# Patient Record
Sex: Male | Born: 1940 | Race: White | Hispanic: No | Marital: Married | Smoking: Never smoker
Health system: Southern US, Community
[De-identification: ages and names within clinical notes are randomized; demographics above are authoritative.]

## PROBLEM LIST (undated history)

## (undated) DIAGNOSIS — I10 Essential (primary) hypertension: Secondary | ICD-10-CM

## (undated) DIAGNOSIS — IMO0002 Reserved for concepts with insufficient information to code with codable children: Secondary | ICD-10-CM

## (undated) DIAGNOSIS — I82409 Acute embolism and thrombosis of unspecified deep veins of unspecified lower extremity: Secondary | ICD-10-CM

## (undated) DIAGNOSIS — T8859XA Other complications of anesthesia, initial encounter: Secondary | ICD-10-CM

## (undated) DIAGNOSIS — R140 Abdominal distension (gaseous): Secondary | ICD-10-CM

## (undated) DIAGNOSIS — D869 Sarcoidosis, unspecified: Secondary | ICD-10-CM

## (undated) DIAGNOSIS — E785 Hyperlipidemia, unspecified: Secondary | ICD-10-CM

## (undated) DIAGNOSIS — M1990 Unspecified osteoarthritis, unspecified site: Secondary | ICD-10-CM

## (undated) DIAGNOSIS — T4145XA Adverse effect of unspecified anesthetic, initial encounter: Secondary | ICD-10-CM

## (undated) DIAGNOSIS — I2699 Other pulmonary embolism without acute cor pulmonale: Secondary | ICD-10-CM

## (undated) DIAGNOSIS — G459 Transient cerebral ischemic attack, unspecified: Secondary | ICD-10-CM

## (undated) DIAGNOSIS — G4730 Sleep apnea, unspecified: Secondary | ICD-10-CM

## (undated) DIAGNOSIS — M069 Rheumatoid arthritis, unspecified: Secondary | ICD-10-CM

## (undated) HISTORY — DX: Reserved for concepts with insufficient information to code with codable children: IMO0002

## (undated) HISTORY — PX: THORACOTOMY: SUR1349

## (undated) HISTORY — PX: OTHER SURGICAL HISTORY: SHX169

## (undated) HISTORY — DX: Unspecified osteoarthritis, unspecified site: M19.90

## (undated) HISTORY — DX: Sarcoidosis, unspecified: D86.9

## (undated) HISTORY — DX: Hyperlipidemia, unspecified: E78.5

## (undated) HISTORY — DX: Essential (primary) hypertension: I10

## (undated) HISTORY — PX: TONSILLECTOMY: SUR1361

## (undated) HISTORY — DX: Abdominal distension (gaseous): R14.0

## (undated) HISTORY — PX: ROTATOR CUFF REPAIR: SHX139

## (undated) HISTORY — PX: EXPLORATORY LAPAROTOMY: SUR591

---

## 2007-02-23 ENCOUNTER — Ambulatory Visit: Payer: Self-pay | Admitting: Gastroenterology

## 2007-03-09 ENCOUNTER — Ambulatory Visit: Payer: Self-pay | Admitting: Gastroenterology

## 2009-07-02 ENCOUNTER — Inpatient Hospital Stay (HOSPITAL_COMMUNITY): Admission: EM | Admit: 2009-07-02 | Discharge: 2009-07-31 | Payer: Self-pay | Admitting: Emergency Medicine

## 2009-07-03 ENCOUNTER — Ambulatory Visit: Payer: Self-pay | Admitting: Internal Medicine

## 2009-07-10 ENCOUNTER — Ambulatory Visit: Payer: Self-pay | Admitting: Physical Medicine & Rehabilitation

## 2009-08-17 ENCOUNTER — Ambulatory Visit (HOSPITAL_COMMUNITY): Admission: RE | Admit: 2009-08-17 | Discharge: 2009-08-17 | Payer: Self-pay | Admitting: General Surgery

## 2009-08-19 ENCOUNTER — Encounter (INDEPENDENT_AMBULATORY_CARE_PROVIDER_SITE_OTHER): Payer: Self-pay | Admitting: Endocrinology

## 2009-08-19 ENCOUNTER — Inpatient Hospital Stay (HOSPITAL_COMMUNITY): Admission: EM | Admit: 2009-08-19 | Discharge: 2009-08-25 | Payer: Self-pay | Admitting: Emergency Medicine

## 2009-08-19 ENCOUNTER — Ambulatory Visit: Payer: Self-pay | Admitting: Internal Medicine

## 2009-08-19 ENCOUNTER — Ambulatory Visit: Payer: Self-pay | Admitting: Vascular Surgery

## 2009-08-19 ENCOUNTER — Encounter: Payer: Self-pay | Admitting: Internal Medicine

## 2009-09-01 ENCOUNTER — Emergency Department (HOSPITAL_COMMUNITY): Admission: EM | Admit: 2009-09-01 | Discharge: 2009-09-01 | Payer: Self-pay | Admitting: Emergency Medicine

## 2009-09-08 ENCOUNTER — Encounter: Admission: RE | Admit: 2009-09-08 | Discharge: 2009-09-08 | Payer: Self-pay | Admitting: Internal Medicine

## 2009-09-16 DIAGNOSIS — E785 Hyperlipidemia, unspecified: Secondary | ICD-10-CM

## 2009-09-16 DIAGNOSIS — I80299 Phlebitis and thrombophlebitis of other deep vessels of unspecified lower extremity: Secondary | ICD-10-CM

## 2009-09-16 DIAGNOSIS — G4730 Sleep apnea, unspecified: Secondary | ICD-10-CM | POA: Insufficient documentation

## 2009-09-16 DIAGNOSIS — E119 Type 2 diabetes mellitus without complications: Secondary | ICD-10-CM

## 2009-09-16 DIAGNOSIS — K219 Gastro-esophageal reflux disease without esophagitis: Secondary | ICD-10-CM

## 2009-09-16 DIAGNOSIS — I4891 Unspecified atrial fibrillation: Secondary | ICD-10-CM | POA: Insufficient documentation

## 2009-09-16 DIAGNOSIS — M1990 Unspecified osteoarthritis, unspecified site: Secondary | ICD-10-CM | POA: Insufficient documentation

## 2009-09-16 DIAGNOSIS — K269 Duodenal ulcer, unspecified as acute or chronic, without hemorrhage or perforation: Secondary | ICD-10-CM | POA: Insufficient documentation

## 2009-09-16 DIAGNOSIS — E669 Obesity, unspecified: Secondary | ICD-10-CM

## 2009-09-16 DIAGNOSIS — I10 Essential (primary) hypertension: Secondary | ICD-10-CM | POA: Insufficient documentation

## 2009-09-17 ENCOUNTER — Ambulatory Visit: Payer: Self-pay | Admitting: Pulmonary Disease

## 2009-09-17 DIAGNOSIS — Z9981 Dependence on supplemental oxygen: Secondary | ICD-10-CM

## 2009-09-17 DIAGNOSIS — I2699 Other pulmonary embolism without acute cor pulmonale: Secondary | ICD-10-CM | POA: Insufficient documentation

## 2009-09-19 DIAGNOSIS — D869 Sarcoidosis, unspecified: Secondary | ICD-10-CM | POA: Insufficient documentation

## 2009-10-05 ENCOUNTER — Telehealth (INDEPENDENT_AMBULATORY_CARE_PROVIDER_SITE_OTHER): Payer: Self-pay | Admitting: *Deleted

## 2009-10-12 ENCOUNTER — Telehealth: Payer: Self-pay | Admitting: Pulmonary Disease

## 2009-12-16 ENCOUNTER — Ambulatory Visit (HOSPITAL_BASED_OUTPATIENT_CLINIC_OR_DEPARTMENT_OTHER): Admission: RE | Admit: 2009-12-16 | Discharge: 2009-12-16 | Payer: Self-pay | Admitting: Pulmonary Disease

## 2009-12-16 ENCOUNTER — Encounter: Payer: Self-pay | Admitting: Pulmonary Disease

## 2009-12-25 ENCOUNTER — Ambulatory Visit: Payer: Self-pay | Admitting: Pulmonary Disease

## 2010-01-12 ENCOUNTER — Telehealth (INDEPENDENT_AMBULATORY_CARE_PROVIDER_SITE_OTHER): Payer: Self-pay | Admitting: *Deleted

## 2010-01-14 ENCOUNTER — Encounter: Payer: Self-pay | Admitting: Pulmonary Disease

## 2010-02-12 ENCOUNTER — Ambulatory Visit: Payer: Self-pay | Admitting: Pulmonary Disease

## 2010-06-29 NOTE — Assessment & Plan Note (Signed)
Summary: rov/apc   Visit Type:  Follow-up Primary Provider/Referring Provider:  Darral Dash  CC:  Pt here for follow up. Pt c/o non-productive cough when laying down at night. Using CPAP everynight.  History of Present Illness: 69/M with DM-2, Htn & sarcoidosis was hospitalised 07/02/09- 07/30/09 following DU perforation requiring emergent surgery & sepsis. He required mechanical ventilation, & was felt to have obesity hypoventilation & had good results with empiric CPAP therapy. He was re-admitted 3/22- 3/29 for extensive bilateral DVT / PE with RV dilatation on echo. He was then dc'd on coumadin & o2 since, last INR was 3.1 on 08/28/09. CPAP is set at 8 cm, he still has abdominal drain & is not very mobile. O2 satn today is 93% on RA & 99 % on 2 L [place].  February 12, 2010 4:06 PM  sleeping well with CPAP PSG showed mild obstructive sleep apnea with predominant hypopneas AHI 11/ & lowest desatn of 81%. Severe PLms @ 113/h. AutoCPAP download showed avg pr of 13 cm, good control of events, good compliance. C/o occasional cough at bedtime - denies post nasal drip, heartburn or wheezing  Current Medications (verified): 1)  Tetracycline Hcl 250 Mg Caps (Tetracycline Hcl) .... Take 1 Tablet By Mouth Once A Day 2)  Apidra Solostar 100 Unit/ml Soln (Insulin Glulisine) .... Sliding Scale 3)  Lantus 100 Unit/ml Soln (Insulin Glargine) .Marland Kitchen.. 16 Every Morning and 22 Units Every Night Subcutaneously 4)  Zocor 40 Mg Tabs (Simvastatin) .Marland Kitchen.. 1 By Mouth At Bedtime 5)  Prilosec 20 Mg Cpdr (Omeprazole) .... Take 1 Tablet By Mouth Once A Day 6)  Furosemide 40 Mg Tabs (Furosemide) .... Take 1 Tablet By Mouth Two Times A Day 7)  Coumadin 3 Mg Tabs (Warfarin Sodium) .... Take 1 Tablet By Mouth Once A Day 8)  Zaroxolyn 2.5 Mg Tabs (Metolazone) .Marland Kitchen.. 1 By Mouth Daily 9)  Synthroid 25 Mcg Tabs (Levothyroxine Sodium) .... Take 1 Tablet By Mouth Once A Day 10)  Vitamin D (Ergocalciferol) 50000 Unit Caps (Ergocalciferol) ....  Take 1 Tablet By Mouth Once A Week 11)  Diltiazem Hcl 120 Mg Tabs (Diltiazem Hcl) .... Take 1 Tablet By Mouth Once A Day 12)  Methotrexate 2.5 Mg Tabs (Methotrexate Sodium) .... Take 6 Tablets By Mouth Once A Week 13)  Glipizide 10 Mg Tabs (Glipizide) .... Take 1 Tablet By Mouth Two Times A Day 14)  Potassium Chloride Crys Cr 20 Meq Cr-Tabs (Potassium Chloride Crys Cr) .... Take 3 Tablet By Mouth Two Times A Day 15)  Multivitamins   Tabs (Multiple Vitamin) .... Take 1 Tablet By Mouth Once A Day 16)  Folic Acid 400 Mcg Tabs (Folic Acid) .... Take 2 1/2 Tablet By Mouth Once A Day  Allergies (verified): No Known Drug Allergies  Past History:  Past Medical History: Last updated: 09/17/2009 Current Problems:  OSTEOARTHRITIS (ICD-715.90) HYPERTENSION (ICD-401.9) SLEEP APNEA (ICD-780.57) ATRIAL FIBRILLATION (ICD-427.31) DUODENAL ULCER (ICD-532.90) OBESITY (ICD-278.00) HYPERLIPIDEMIA (ICD-272.4) G E R D (ICD-530.81) DIABETES, TYPE 2 (ICD-250.00) Hx of DEEP VEIN THROMBOSIS/PHLEBITIS (ICD-451.19) Sarcoidosis - thoracotomy in 1973  Social History: Last updated: 09/17/2009 Married 4 children Patient never smoked.   Review of Systems       The patient complains of prolonged cough.  The patient denies anorexia, fever, weight loss, weight gain, vision loss, decreased hearing, hoarseness, chest pain, syncope, dyspnea on exertion, peripheral edema, headaches, hemoptysis, abdominal pain, melena, hematochezia, severe indigestion/heartburn, hematuria, muscle weakness, suspicious skin lesions, difficulty walking, depression, unusual weight change, abnormal bleeding, enlarged  lymph nodes, and angioedema.    Vital Signs:  Patient profile:   70 year old male Height:      73 inches Weight:      255.50 pounds O2 Sat:      96 % on Room air Temp:     98.4 degrees F oral Pulse rate:   90 / minute BP sitting:   100 / 60  (left arm) Cuff size:   large  Vitals Entered By: Zackery Barefoot CMA  (February 12, 2010 3:45 PM)  O2 Flow:  Room air CC: Pt here for follow up. Pt c/o non-productive cough when laying down at night. Using CPAP everynight Comments Medications reviewed with patient Verified contact number and pharmacy with patient Zackery Barefoot [facility]  February 12, 2010 3:46 PM    Physical Exam  Additional Exam:  wt 255 February 12, 2010  Gen. Pleasant, well-nourished, in no distress, normal affect, ambulates with cane ENT - no lesions, no post nasal drip, class 2 airway Neck: No JVD, no thyromegaly, no carotid bruits Lungs: no use of accessory muscles, no dullness to percussion, clear without rales or rhonchi  Cardiovascular: Rhythm regular, heart sounds  normal, no murmurs or gallops, no peripheral edema Musculoskeletal: No deformities, no cyanosis or clubbing      Impression & Recommendations:  Problem # 1:  SLEEP APNEA (ICD-780.57) Change to fixed CPAP 13 cm, get download in 1 month Compliance encouraged, wt loss emphasized, asked to avoid meds with sedative side effects, cautioned against driving when sleepy.  Orders: DME Referral (DME) Est. Patient Level IV (40981)  Problem # 2:  SARCOIDOSIS (ICD-135) ? cause of cough Some bibasal scarring on abd CT  - FU CXR today  Medications Added to Medication List This Visit: 1)  Tetracycline Hcl 250 Mg Caps (Tetracycline hcl) .... Take 1 tablet by mouth once a day 2)  Apidra Solostar 100 Unit/ml Soln (Insulin glulisine) .... Sliding scale 3)  Lantus 100 Unit/ml Soln (Insulin glargine) .Marland Kitchen.. 16 every morning and 22 units every night subcutaneously 4)  Furosemide 40 Mg Tabs (Furosemide) .... Take 1 tablet by mouth two times a day 5)  Coumadin 3 Mg Tabs (Warfarin sodium) .... Take 1 tablet by mouth once a day 6)  Synthroid 25 Mcg Tabs (Levothyroxine sodium) .... Take 1 tablet by mouth once a day 7)  Vitamin D (ergocalciferol) 50000 Unit Caps (Ergocalciferol) .... Take 1 tablet by mouth once a week 8)  Diltiazem Hcl  120 Mg Tabs (Diltiazem hcl) .... Take 1 tablet by mouth once a day 9)  Methotrexate 2.5 Mg Tabs (Methotrexate sodium) .... Take 6 tablets by mouth once a week 10)  Glipizide 10 Mg Tabs (Glipizide) .... Take 1 tablet by mouth two times a day 11)  Potassium Chloride Crys Cr 20 Meq Cr-tabs (Potassium chloride crys cr) .... Take 3 tablet by mouth two times a day 12)  Multivitamins Tabs (Multiple vitamin) .... Take 1 tablet by mouth once a day 13)  Folic Acid 400 Mcg Tabs (Folic acid) .... Take 2 1/2 tablet by mouth once a day  Other Orders: T-2 View CXR ([id number])  Patient Instructions: 1)  Copy sent to:dr tisovec 2)  Please schedule a follow-up appointment in 6 months. 3)  I will change to CPAP of 13 cm & get a download in 1 month

## 2010-06-29 NOTE — Progress Notes (Signed)
Summary: results  Phone Note Call from Patient Call back at [phone]   Caller: Patient Call For: alva Summary of Call: calling for results of sleep study Initial call taken by: Rickard Patience,  January 12, 2010 1:49 PM  Follow-up for Phone Call        pt calling for sleep study results done at [facility] on 12-16-2009.  Please advise.  Thanks.  Aundra Millet Reynolds LPN  January 12, 2010 2:04 PM   Mild obstructive sleep apnea  - continue on CPAP for now, discuss on FU visit, obtain CPAP download Follow-up by: Comer Locket. Vassie Loll MD,  January 12, 2010 2:37 PM  Additional Follow-up for Phone Call Additional follow up Details #1::        Pt aware of results and is making follow up visit with RA for mid September.Reynaldo Minium CMA  January 12, 2010 2:47 PM      Appended Document: results download 7/30 -8/18 >> good avg use, no residual events, avg pr 13 cm

## 2010-06-29 NOTE — Progress Notes (Signed)
Summary: cpap order  Phone Note Call from Patient Call back at Home Phone [phone]   Caller: Spouse//Fred Alvarez Call For: Fred Alvarez Summary of Call: Wants order for cpap machine. Initial call taken by: Darletta Moll,  Oct 05, 2009 10:22 AM  Follow-up for Phone Call        Pt has been discharged home and needs an order for a cpap machine to have at home sent to [facility]. Please advise if ok to send order.Carron Curie CMA  Oct 05, 2009 10:52 AM   done Follow-up by: Comer Locket. Vassie Loll MD,  Oct 05, 2009 1:14 PM  Additional Follow-up for Phone Call Additional follow up Details #1::        Spoke with pt and advised that order was sent for his cpap machine. Additional Follow-up by: Vernie Murders,  Oct 05, 2009 1:36 PM

## 2010-06-29 NOTE — Assessment & Plan Note (Signed)
Summary: 4 week post hosp per Alva/apc   Visit Type:  Hospital Follow-up Primary Provider/Referring Provider:  Tisovek  CC:  Pt is here for a post hosp f/u appt.  Pt states breathing is "good."  Pt denied a cough.  Pt  states he is wearing his cpap machine almost every night.  Pt denied any pressure with cpap machine.  Marland Kitchen  History of Present Illness: 69/M with DM-2, Htn & sarcoidosis was hospitalised 07/02/09- 07/30/09 following DU perforation requiring emergent surgery & sepsis. He required mechanical ventilation, & was felt to have obesity hypoventilation & had good results with empiric CPAP therapy. He was re-admitted 3/22- 3/29 for extensive bilateral DVT / PE with RV dilatation on echo. He was then dc'd on coumadin & o2 since, last INR was 3.1 on 08/28/09. CPAP is set at 8 cm, he still has abdominal drain & is not very mobile. O2 satn today is 93% on RA & 99 % on 2 L [place].  Preventive Screening-Counseling & Management  Alcohol-Tobacco     Smoking Status: never  Medications Prior to Update: 1)  Lantus 100 Unit/ml Soln (Insulin Glargine) .Marland Kitchen.. 12u Subcutaneously Two Times A Day 2)  Zocor 40 Mg Tabs (Simvastatin) .Marland Kitchen.. 1 By Mouth At Bedtime 3)  Protonix 40 Mg Tbec (Pantoprazole Sodium) .Marland Kitchen.. 1 By Mouth Daily 4)  Furosemide 40 Mg Tabs (Furosemide) .Marland Kitchen.. 1 By Mouth Three Times A Day 5)  Coumadin 5 Mg Tabs (Warfarin Sodium) .... As Directed 6)  Santyl 250 Unit/gm Oint (Collagenase) .... Apply To Wound Once Daily 7)  Zaroxolyn 2.5 Mg Tabs (Metolazone) .Marland Kitchen.. 1 By Mouth Daily 8)  Lotrimin Ultra 1 % Crea (Butenafine Hcl) .... Apply To Groin Rash Two Times A Day 9)  Ensure Pudding  Pudg (Nutritional Supplements) .Marland Kitchen.. 1 Three Times A Day 10)  Tylenol 325 Mg Tabs (Acetaminophen) .Marland Kitchen.. 1 To 2 By Mouth Every 6 Hours As Needed 11)  Ambien 5 Mg Tabs (Zolpidem Tartrate) .Marland Kitchen.. 1 By Mouth At Bedtime As Needed 12)  Gentle Laxative 5 Mg Tbec (Bisacodyl) .... As Needed  Current Medications (verified): 1)  Lantus 100  Unit/ml Soln (Insulin Glargine) .Marland Kitchen.. 12u Subcutaneously Two Times A Day 2)  Zocor 40 Mg Tabs (Simvastatin) .Marland Kitchen.. 1 By Mouth At Bedtime 3)  Prilosec 20 Mg Cpdr (Omeprazole) .... Take 1 Tablet By Mouth Once A Day 4)  Furosemide 40 Mg Tabs (Furosemide) .Marland Kitchen.. 1 By Mouth Three Times A Day 5)  Coumadin 5 Mg Tabs (Warfarin Sodium) .... As Directed 6)  Zaroxolyn 2.5 Mg Tabs (Metolazone) .Marland Kitchen.. 1 By Mouth Daily 7)  Tylenol 325 Mg Tabs (Acetaminophen) .Marland Kitchen.. 1 To 2 By Mouth Every 6 Hours As Needed 8)  Ambien 5 Mg Tabs (Zolpidem Tartrate) .Marland Kitchen.. 1 By Mouth At Bedtime As Needed 9)  Humalog 100 Unit/ml Soln (Insulin Lispro (Human)) .... Use As Directed  Allergies (verified): No Known Drug Allergies  Past History:  Past Medical History: Current Problems:  OSTEOARTHRITIS (ICD-715.90) HYPERTENSION (ICD-401.9) SLEEP APNEA (ICD-780.57) ATRIAL FIBRILLATION (ICD-427.31) DUODENAL ULCER (ICD-532.90) OBESITY (ICD-278.00) HYPERLIPIDEMIA (ICD-272.4) G E R D (ICD-530.81) DIABETES, TYPE 2 (ICD-250.00) Hx of DEEP VEIN THROMBOSIS/PHLEBITIS (ICD-451.19) Sarcoidosis - thoracotomy in 1973  Past Surgical History: Thoracotomy Right Rotator Cuff Repair  Family History: Family History Pulmonary Embolism/Clot---father Family History C V A / Stroke ---mother Family History Hypertension---3 brothers  Social History: Married 4 children Patient never smoked.  Smoking Status:  never  Review of Systems       The patient complains of  shortness of breath with activity and weight change.  The patient denies shortness of breath at rest, productive cough, non-productive cough, coughing up blood, chest pain, irregular heartbeats, acid heartburn, indigestion, loss of appetite, abdominal pain, difficulty swallowing, sore throat, tooth/dental problems, headaches, nasal congestion/difficulty breathing through nose, sneezing, itching, ear ache, anxiety, depression, hand/feet swelling, joint stiffness or pain, rash, change in color  of mucus, and fever.    Vital Signs:  Patient profile:   70 year old male Height:      73 inches BMI:     39.20 O2 Sat:      99 % on 2 LPM  Temp:     97.3 degrees F oral Pulse rate:   104 / minute BP sitting:   100 / 78  (right arm) Cuff size:   large  Vitals Entered By: Arman Filter LPN (September 17, 2009 1:39 PM)  O2 Flow:  2 LPM  CC: Pt is here for a post hosp f/u appt.  Pt states breathing is "good."  Pt denied a cough.  Pt  states he is wearing his cpap machine almost every night.  Pt denied any pressure with cpap machine.   Comments Unable to weigh pt.  Pt is wheelchair bound. Medications reviewed with patient Arman Filter LPN  September 17, 2009 1:39 PM   checked pt's o2 sats on RA.  o2 sats were 93% on RA, therefore per RA- ok for pt to be off oxygen at rest but still needs to wear at bedtime and with exertion. pt informed of this.   Aundra Millet Reynolds LPN  September 17, 2009 2:50 PM    Physical Exam  Additional Exam:  Gen. 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 Hopefully his mobility will increase His updated medication list for this problem includes:    Coumadin 5 Mg Tabs (Warfarin sodium) .Marland Kitchen... As directed  Orders: Est. Patient Level IV (46962)  Problem # 2:  SLEEP APNEA (ICD-780.57)  He ahs lost  considerable weight sinc ehis hospital admission Will schedule sleep study once more mobile & over acute illness Meanwhile ct empiric CPAP 8 cm , if worse excessive daytime somnolence , can change to auto 5-15  Seems to be tolerating well.  Orders: Est. 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  Orders: Est. Patient Level IV (13244)  Medications Added to Medication List This Visit: 1)  Prilosec 20 Mg Cpdr (Omeprazole) .... Take 1 tablet by mouth once a day 2)  Humalog 100 Unit/ml Soln (Insulin lispro (human)) .... Use as directed  Patient Instructions: 1)  Copy sent to: Dr Evlyn Kanner 2)  Please schedule a follow-up appointment in 1 month. 3)  You should be placed on autoCPAP 5-15 4)  Check oxygen at rest on RA  Immunization History:  Influenza Immunization History:    Influenza:  historical (02/27/2009)  Pneumovax Immunization History:    Pneumovax:  historical (02/27/2009)  Appended Document: 4 week post hosp per Alva/apc Split Night use 02 as needed scheduled for 12/16/09. Order faxed.

## 2010-06-29 NOTE — Progress Notes (Signed)
Summary: ?appt  Phone Note Call from Patient Call back at Home Phone [phone]   Caller: Pasty Spillers Call For: alva Reason for Call: Talk to Doctor Summary of Call: Per Shanda Bumps, pt hasn't had sleep study yet per wife.  Just out of hosp & rehab, not mobile enough to go for sleep study.  Please advise if pt needs to keep appt on 10/16/2009 w/Alva? Initial call taken by: Eugene Gavia,  Oct 12, 2009 9:32 AM  Follow-up for Phone Call        ok to defer until after sleep study  Follow-up by: Comer Locket. Vassie Loll MD,  Oct 12, 2009 12:43 PM  Additional Follow-up for Phone Call Additional follow up Details #1::        Pt informed of Dr Reginia Naas recommendations. Pt will reschedule when he feels he is able. Abigail Miyamoto RN  Oct 12, 2009 1:18 PM

## 2010-08-19 LAB — COMPREHENSIVE METABOLIC PANEL
ALT: 22 U/L (ref 0–53)
ALT: 38 U/L (ref 0–53)
ALT: 50 U/L (ref 0–53)
ALT: 51 U/L (ref 0–53)
ALT: 62 U/L — ABNORMAL HIGH (ref 0–53)
AST: 24 U/L (ref 0–37)
AST: 27 U/L (ref 0–37)
AST: 34 U/L (ref 0–37)
AST: 39 U/L — ABNORMAL HIGH (ref 0–37)
AST: 48 U/L — ABNORMAL HIGH (ref 0–37)
AST: 54 U/L — ABNORMAL HIGH (ref 0–37)
AST: 55 U/L — ABNORMAL HIGH (ref 0–37)
AST: 59 U/L — ABNORMAL HIGH (ref 0–37)
Albumin: 1.5 g/dL — ABNORMAL LOW (ref 3.5–5.2)
Albumin: 1.7 g/dL — ABNORMAL LOW (ref 3.5–5.2)
Albumin: 2 g/dL — ABNORMAL LOW (ref 3.5–5.2)
Albumin: 2 g/dL — ABNORMAL LOW (ref 3.5–5.2)
Albumin: 2.1 g/dL — ABNORMAL LOW (ref 3.5–5.2)
Alkaline Phosphatase: 145 U/L — ABNORMAL HIGH (ref 39–117)
Alkaline Phosphatase: 154 U/L — ABNORMAL HIGH (ref 39–117)
Alkaline Phosphatase: 181 U/L — ABNORMAL HIGH (ref 39–117)
BUN: 21 mg/dL (ref 6–23)
BUN: 35 mg/dL — ABNORMAL HIGH (ref 6–23)
BUN: 46 mg/dL — ABNORMAL HIGH (ref 6–23)
CO2: 34 mEq/L — ABNORMAL HIGH (ref 19–32)
CO2: 35 mEq/L — ABNORMAL HIGH (ref 19–32)
CO2: 35 mEq/L — ABNORMAL HIGH (ref 19–32)
CO2: 38 mEq/L — ABNORMAL HIGH (ref 19–32)
CO2: 40 mEq/L — ABNORMAL HIGH (ref 19–32)
Calcium: 8.2 mg/dL — ABNORMAL LOW (ref 8.4–10.5)
Calcium: 8.4 mg/dL (ref 8.4–10.5)
Calcium: 8.6 mg/dL (ref 8.4–10.5)
Chloride: 102 mEq/L (ref 96–112)
Chloride: 102 mEq/L (ref 96–112)
Chloride: 102 mEq/L (ref 96–112)
Chloride: 104 mEq/L (ref 96–112)
Chloride: 106 mEq/L (ref 96–112)
Chloride: 91 mEq/L — ABNORMAL LOW (ref 96–112)
Creatinine, Ser: 1.12 mg/dL (ref 0.4–1.5)
Creatinine, Ser: 1.18 mg/dL (ref 0.4–1.5)
Creatinine, Ser: 1.28 mg/dL (ref 0.4–1.5)
Creatinine, Ser: 1.33 mg/dL (ref 0.4–1.5)
Creatinine, Ser: 1.36 mg/dL (ref 0.4–1.5)
Creatinine, Ser: 1.5 mg/dL (ref 0.4–1.5)
GFR calc Af Amer: >60 mL/min (ref >60)
GFR calc Af Amer: >60 mL/min (ref >60)
GFR calc Af Amer: >60 mL/min (ref >60)
GFR calc Af Amer: >60 mL/min (ref >60)
GFR calc Af Amer: >60 mL/min (ref >60)
GFR calc Af Amer: >60 mL/min (ref >60)
GFR calc Af Amer: >60 mL/min (ref >60)
GFR calc non Af Amer: 52 mL/min — ABNORMAL LOW (ref >60)
GFR calc non Af Amer: >60 mL/min (ref >60)
GFR calc non Af Amer: >60 mL/min (ref >60)
GFR calc non Af Amer: >60 mL/min (ref >60)
Glucose, Bld: 127 mg/dL — ABNORMAL HIGH (ref 70–99)
Glucose, Bld: 156 mg/dL — ABNORMAL HIGH (ref 70–99)
Potassium: 3.7 mEq/L (ref 3.5–5.1)
Potassium: 4 mEq/L (ref 3.5–5.1)
Potassium: 4.2 mEq/L (ref 3.5–5.1)
Sodium: 134 mEq/L — ABNORMAL LOW (ref 135–145)
Sodium: 137 mEq/L (ref 135–145)
Sodium: 137 mEq/L (ref 135–145)
Sodium: 141 mEq/L (ref 135–145)
Sodium: 145 mEq/L (ref 135–145)
Sodium: 148 mEq/L — ABNORMAL HIGH (ref 135–145)
Total Bilirubin: 0.8 mg/dL (ref 0.3–1.2)
Total Bilirubin: 1.5 mg/dL — ABNORMAL HIGH (ref 0.3–1.2)
Total Bilirubin: 1.8 mg/dL — ABNORMAL HIGH (ref 0.3–1.2)
Total Bilirubin: 1.8 mg/dL — ABNORMAL HIGH (ref 0.3–1.2)
Total Protein: 5.2 g/dL — ABNORMAL LOW (ref 6.0–8.3)
Total Protein: 5.2 g/dL — ABNORMAL LOW (ref 6.0–8.3)
Total Protein: 5.6 g/dL — ABNORMAL LOW (ref 6.0–8.3)
Total Protein: 5.8 g/dL — ABNORMAL LOW (ref 6.0–8.3)
Total Protein: 6 g/dL (ref 6.0–8.3)

## 2010-08-19 LAB — CBC
HCT: 28.6 % — ABNORMAL LOW (ref 39.0–52.0)
HCT: 29.7 % — ABNORMAL LOW (ref 39.0–52.0)
HCT: 30.6 % — ABNORMAL LOW (ref 39.0–52.0)
HCT: 30.7 % — ABNORMAL LOW (ref 39.0–52.0)
HCT: 30.9 % — ABNORMAL LOW (ref 39.0–52.0)
HCT: 31.3 % — ABNORMAL LOW (ref 39.0–52.0)
HCT: 31.7 % — ABNORMAL LOW (ref 39.0–52.0)
HCT: 31.8 % — ABNORMAL LOW (ref 39.0–52.0)
HCT: 33.5 % — ABNORMAL LOW (ref 39.0–52.0)
HCT: 36.4 % — ABNORMAL LOW (ref 39.0–52.0)
HCT: 37.1 % — ABNORMAL LOW (ref 39.0–52.0)
HCT: 37.2 % — ABNORMAL LOW (ref 39.0–52.0)
Hemoglobin: 10.2 g/dL — ABNORMAL LOW (ref 13.0–17.0)
Hemoglobin: 10.4 g/dL — ABNORMAL LOW (ref 13.0–17.0)
Hemoglobin: 10.4 g/dL — ABNORMAL LOW (ref 13.0–17.0)
Hemoglobin: 10.5 g/dL — ABNORMAL LOW (ref 13.0–17.0)
Hemoglobin: 11.3 g/dL — ABNORMAL LOW (ref 13.0–17.0)
Hemoglobin: 12 g/dL — ABNORMAL LOW (ref 13.0–17.0)
Hemoglobin: 12.2 g/dL — ABNORMAL LOW (ref 13.0–17.0)
Hemoglobin: 12.3 g/dL — ABNORMAL LOW (ref 13.0–17.0)
Hemoglobin: 9.4 g/dL — ABNORMAL LOW (ref 13.0–17.0)
Hemoglobin: 9.6 g/dL — ABNORMAL LOW (ref 13.0–17.0)
MCHC: 32.7 g/dL (ref 30.0–36.0)
MCHC: 32.8 g/dL (ref 30.0–36.0)
MCHC: 33 g/dL (ref 30.0–36.0)
MCHC: 33.2 g/dL (ref 30.0–36.0)
MCHC: 33.4 g/dL (ref 30.0–36.0)
MCHC: 33.7 g/dL (ref 30.0–36.0)
MCHC: 33.7 g/dL (ref 30.0–36.0)
MCHC: 33.7 g/dL (ref 30.0–36.0)
MCHC: 33.7 g/dL (ref 30.0–36.0)
MCHC: 33.8 g/dL (ref 30.0–36.0)
MCHC: 34 g/dL (ref 30.0–36.0)
MCHC: 34 g/dL (ref 30.0–36.0)
MCV: 88.9 fL (ref 78.0–100.0)
MCV: 90.2 fL (ref 78.0–100.0)
MCV: 90.2 fL (ref 78.0–100.0)
MCV: 90.3 fL (ref 78.0–100.0)
MCV: 90.3 fL (ref 78.0–100.0)
MCV: 90.4 fL (ref 78.0–100.0)
MCV: 90.7 fL (ref 78.0–100.0)
MCV: 91 fL (ref 78.0–100.0)
MCV: 91.1 fL (ref 78.0–100.0)
MCV: 91.2 fL (ref 78.0–100.0)
MCV: 91.3 fL (ref 78.0–100.0)
MCV: 91.4 fL (ref 78.0–100.0)
MCV: 91.4 fL (ref 78.0–100.0)
MCV: 91.9 fL (ref 78.0–100.0)
MCV: 92.2 fL (ref 78.0–100.0)
MCV: 92.3 fL (ref 78.0–100.0)
Platelets: 176 10*3/uL (ref 150–400)
Platelets: 193 10*3/uL (ref 150–400)
Platelets: 196 10*3/uL (ref 150–400)
Platelets: 242 10*3/uL (ref 150–400)
Platelets: 252 10*3/uL (ref 150–400)
Platelets: 260 10*3/uL (ref 150–400)
Platelets: 269 10*3/uL (ref 150–400)
Platelets: 272 10*3/uL (ref 150–400)
Platelets: 313 10*3/uL (ref 150–400)
Platelets: 334 10*3/uL (ref 150–400)
Platelets: 336 10*3/uL (ref 150–400)
Platelets: 343 10*3/uL (ref 150–400)
RBC: 3.1 MIL/uL — ABNORMAL LOW (ref 4.22–5.81)
RBC: 3.14 MIL/uL — ABNORMAL LOW (ref 4.22–5.81)
RBC: 3.31 MIL/uL — ABNORMAL LOW (ref 4.22–5.81)
RBC: 3.32 MIL/uL — ABNORMAL LOW (ref 4.22–5.81)
RBC: 3.35 MIL/uL — ABNORMAL LOW (ref 4.22–5.81)
RBC: 3.47 MIL/uL — ABNORMAL LOW (ref 4.22–5.81)
RBC: 3.47 MIL/uL — ABNORMAL LOW (ref 4.22–5.81)
RBC: 3.58 MIL/uL — ABNORMAL LOW (ref 4.22–5.81)
RBC: 3.68 MIL/uL — ABNORMAL LOW (ref 4.22–5.81)
RBC: 3.83 MIL/uL — ABNORMAL LOW (ref 4.22–5.81)
RBC: 3.97 MIL/uL — ABNORMAL LOW (ref 4.22–5.81)
RBC: 4.03 MIL/uL — ABNORMAL LOW (ref 4.22–5.81)
RBC: 4.09 MIL/uL — ABNORMAL LOW (ref 4.22–5.81)
RDW: 15.6 % — ABNORMAL HIGH (ref 11.5–15.5)
RDW: 15.7 % — ABNORMAL HIGH (ref 11.5–15.5)
RDW: 15.8 % — ABNORMAL HIGH (ref 11.5–15.5)
RDW: 15.8 % — ABNORMAL HIGH (ref 11.5–15.5)
RDW: 15.9 % — ABNORMAL HIGH (ref 11.5–15.5)
RDW: 15.9 % — ABNORMAL HIGH (ref 11.5–15.5)
RDW: 16 % — ABNORMAL HIGH (ref 11.5–15.5)
RDW: 16.1 % — ABNORMAL HIGH (ref 11.5–15.5)
RDW: 16.1 % — ABNORMAL HIGH (ref 11.5–15.5)
RDW: 16.1 % — ABNORMAL HIGH (ref 11.5–15.5)
RDW: 16.1 % — ABNORMAL HIGH (ref 11.5–15.5)
RDW: 16.3 % — ABNORMAL HIGH (ref 11.5–15.5)
WBC: 10.5 10*3/uL (ref 4.0–10.5)
WBC: 11.4 10*3/uL — ABNORMAL HIGH (ref 4.0–10.5)
WBC: 13.4 10*3/uL — ABNORMAL HIGH (ref 4.0–10.5)
WBC: 13.4 10*3/uL — ABNORMAL HIGH (ref 4.0–10.5)
WBC: 5.4 10*3/uL (ref 4.0–10.5)
WBC: 5.6 10*3/uL (ref 4.0–10.5)
WBC: 6.2 10*3/uL (ref 4.0–10.5)
WBC: 6.3 10*3/uL (ref 4.0–10.5)
WBC: 7.8 10*3/uL (ref 4.0–10.5)
WBC: 7.8 10*3/uL (ref 4.0–10.5)
WBC: 9.6 10*3/uL (ref 4.0–10.5)

## 2010-08-19 LAB — URINALYSIS, ROUTINE W REFLEX MICROSCOPIC
Glucose, UA: NEGATIVE mg/dL
Ketones, ur: NEGATIVE mg/dL
Nitrite: NEGATIVE
Nitrite: POSITIVE — AB
Protein, ur: NEGATIVE mg/dL
Specific Gravity, Urine: 1.028 (ref 1.005–1.030)
Urobilinogen, UA: 0.2 mg/dL (ref 0.0–1.0)
Urobilinogen, UA: 1 mg/dL (ref 0.0–1.0)
pH: 5 (ref 5.0–8.0)
pH: 6 (ref 5.0–8.0)

## 2010-08-19 LAB — GLUCOSE, CAPILLARY
Glucose-Capillary: 108 mg/dL — ABNORMAL HIGH (ref 70–99)
Glucose-Capillary: 114 mg/dL — ABNORMAL HIGH (ref 70–99)
Glucose-Capillary: 116 mg/dL — ABNORMAL HIGH (ref 70–99)
Glucose-Capillary: 116 mg/dL — ABNORMAL HIGH (ref 70–99)
Glucose-Capillary: 118 mg/dL — ABNORMAL HIGH (ref 70–99)
Glucose-Capillary: 120 mg/dL — ABNORMAL HIGH (ref 70–99)
Glucose-Capillary: 122 mg/dL — ABNORMAL HIGH (ref 70–99)
Glucose-Capillary: 124 mg/dL — ABNORMAL HIGH (ref 70–99)
Glucose-Capillary: 127 mg/dL — ABNORMAL HIGH (ref 70–99)
Glucose-Capillary: 127 mg/dL — ABNORMAL HIGH (ref 70–99)
Glucose-Capillary: 127 mg/dL — ABNORMAL HIGH (ref 70–99)
Glucose-Capillary: 127 mg/dL — ABNORMAL HIGH (ref 70–99)
Glucose-Capillary: 128 mg/dL — ABNORMAL HIGH (ref 70–99)
Glucose-Capillary: 128 mg/dL — ABNORMAL HIGH (ref 70–99)
Glucose-Capillary: 129 mg/dL — ABNORMAL HIGH (ref 70–99)
Glucose-Capillary: 129 mg/dL — ABNORMAL HIGH (ref 70–99)
Glucose-Capillary: 131 mg/dL — ABNORMAL HIGH (ref 70–99)
Glucose-Capillary: 133 mg/dL — ABNORMAL HIGH (ref 70–99)
Glucose-Capillary: 133 mg/dL — ABNORMAL HIGH (ref 70–99)
Glucose-Capillary: 134 mg/dL — ABNORMAL HIGH (ref 70–99)
Glucose-Capillary: 134 mg/dL — ABNORMAL HIGH (ref 70–99)
Glucose-Capillary: 136 mg/dL — ABNORMAL HIGH (ref 70–99)
Glucose-Capillary: 139 mg/dL — ABNORMAL HIGH (ref 70–99)
Glucose-Capillary: 140 mg/dL — ABNORMAL HIGH (ref 70–99)
Glucose-Capillary: 141 mg/dL — ABNORMAL HIGH (ref 70–99)
Glucose-Capillary: 142 mg/dL — ABNORMAL HIGH (ref 70–99)
Glucose-Capillary: 143 mg/dL — ABNORMAL HIGH (ref 70–99)
Glucose-Capillary: 146 mg/dL — ABNORMAL HIGH (ref 70–99)
Glucose-Capillary: 147 mg/dL — ABNORMAL HIGH (ref 70–99)
Glucose-Capillary: 147 mg/dL — ABNORMAL HIGH (ref 70–99)
Glucose-Capillary: 149 mg/dL — ABNORMAL HIGH (ref 70–99)
Glucose-Capillary: 152 mg/dL — ABNORMAL HIGH (ref 70–99)
Glucose-Capillary: 153 mg/dL — ABNORMAL HIGH (ref 70–99)
Glucose-Capillary: 155 mg/dL — ABNORMAL HIGH (ref 70–99)
Glucose-Capillary: 155 mg/dL — ABNORMAL HIGH (ref 70–99)
Glucose-Capillary: 160 mg/dL — ABNORMAL HIGH (ref 70–99)
Glucose-Capillary: 161 mg/dL — ABNORMAL HIGH (ref 70–99)
Glucose-Capillary: 167 mg/dL — ABNORMAL HIGH (ref 70–99)
Glucose-Capillary: 169 mg/dL — ABNORMAL HIGH (ref 70–99)
Glucose-Capillary: 172 mg/dL — ABNORMAL HIGH (ref 70–99)
Glucose-Capillary: 172 mg/dL — ABNORMAL HIGH (ref 70–99)
Glucose-Capillary: 175 mg/dL — ABNORMAL HIGH (ref 70–99)
Glucose-Capillary: 175 mg/dL — ABNORMAL HIGH (ref 70–99)
Glucose-Capillary: 178 mg/dL — ABNORMAL HIGH (ref 70–99)
Glucose-Capillary: 183 mg/dL — ABNORMAL HIGH (ref 70–99)
Glucose-Capillary: 185 mg/dL — ABNORMAL HIGH (ref 70–99)
Glucose-Capillary: 189 mg/dL — ABNORMAL HIGH (ref 70–99)
Glucose-Capillary: 190 mg/dL — ABNORMAL HIGH (ref 70–99)
Glucose-Capillary: 201 mg/dL — ABNORMAL HIGH (ref 70–99)
Glucose-Capillary: 202 mg/dL — ABNORMAL HIGH (ref 70–99)
Glucose-Capillary: 220 mg/dL — ABNORMAL HIGH (ref 70–99)
Glucose-Capillary: 222 mg/dL — ABNORMAL HIGH (ref 70–99)
Glucose-Capillary: 228 mg/dL — ABNORMAL HIGH (ref 70–99)
Glucose-Capillary: 246 mg/dL — ABNORMAL HIGH (ref 70–99)
Glucose-Capillary: 248 mg/dL — ABNORMAL HIGH (ref 70–99)
Glucose-Capillary: 248 mg/dL — ABNORMAL HIGH (ref 70–99)
Glucose-Capillary: 72 mg/dL (ref 70–99)
Glucose-Capillary: 79 mg/dL (ref 70–99)
Glucose-Capillary: 91 mg/dL (ref 70–99)
Glucose-Capillary: 93 mg/dL (ref 70–99)
Glucose-Capillary: 93 mg/dL (ref 70–99)
Glucose-Capillary: 97 mg/dL (ref 70–99)

## 2010-08-19 LAB — BASIC METABOLIC PANEL
BUN: 22 mg/dL (ref 6–23)
BUN: 28 mg/dL — ABNORMAL HIGH (ref 6–23)
BUN: 29 mg/dL — ABNORMAL HIGH (ref 6–23)
BUN: 37 mg/dL — ABNORMAL HIGH (ref 6–23)
BUN: 37 mg/dL — ABNORMAL HIGH (ref 6–23)
BUN: 44 mg/dL — ABNORMAL HIGH (ref 6–23)
BUN: 48 mg/dL — ABNORMAL HIGH (ref 6–23)
BUN: 50 mg/dL — ABNORMAL HIGH (ref 6–23)
CO2: 18 mEq/L — ABNORMAL LOW (ref 19–32)
CO2: 24 mEq/L (ref 19–32)
CO2: 28 mEq/L (ref 19–32)
CO2: 30 mEq/L (ref 19–32)
CO2: 32 mEq/L (ref 19–32)
CO2: 34 mEq/L — ABNORMAL HIGH (ref 19–32)
CO2: 36 mEq/L — ABNORMAL HIGH (ref 19–32)
CO2: 39 mEq/L — ABNORMAL HIGH (ref 19–32)
CO2: 39 mEq/L — ABNORMAL HIGH (ref 19–32)
CO2: 43 mEq/L (ref 19–32)
Calcium: 6.3 mg/dL — CL (ref 8.4–10.5)
Calcium: 6.4 mg/dL — CL (ref 8.4–10.5)
Calcium: 7.8 mg/dL — ABNORMAL LOW (ref 8.4–10.5)
Calcium: 8 mg/dL — ABNORMAL LOW (ref 8.4–10.5)
Calcium: 8.3 mg/dL — ABNORMAL LOW (ref 8.4–10.5)
Calcium: 8.6 mg/dL (ref 8.4–10.5)
Calcium: 8.6 mg/dL (ref 8.4–10.5)
Calcium: 8.9 mg/dL (ref 8.4–10.5)
Chloride: 101 mEq/L (ref 96–112)
Chloride: 104 mEq/L (ref 96–112)
Chloride: 105 mEq/L (ref 96–112)
Chloride: 105 mEq/L (ref 96–112)
Chloride: 107 mEq/L (ref 96–112)
Chloride: 108 mEq/L (ref 96–112)
Chloride: 116 mEq/L — ABNORMAL HIGH (ref 96–112)
Chloride: 90 mEq/L — ABNORMAL LOW (ref 96–112)
Chloride: 95 mEq/L — ABNORMAL LOW (ref 96–112)
Chloride: 98 mEq/L (ref 96–112)
Chloride: 98 mEq/L (ref 96–112)
Creatinine, Ser: 1.05 mg/dL (ref 0.4–1.5)
Creatinine, Ser: 1.19 mg/dL (ref 0.4–1.5)
Creatinine, Ser: 1.2 mg/dL (ref 0.4–1.5)
Creatinine, Ser: 1.21 mg/dL (ref 0.4–1.5)
Creatinine, Ser: 1.33 mg/dL (ref 0.4–1.5)
Creatinine, Ser: 1.35 mg/dL (ref 0.4–1.5)
Creatinine, Ser: 1.38 mg/dL (ref 0.4–1.5)
Creatinine, Ser: 1.72 mg/dL — ABNORMAL HIGH (ref 0.4–1.5)
Creatinine, Ser: 1.73 mg/dL — ABNORMAL HIGH (ref 0.4–1.5)
Creatinine, Ser: 1.86 mg/dL — ABNORMAL HIGH (ref 0.4–1.5)
Creatinine, Ser: 2.27 mg/dL — ABNORMAL HIGH (ref 0.4–1.5)
GFR calc Af Amer: 35 mL/min — ABNORMAL LOW (ref >60)
GFR calc Af Amer: 37 mL/min — ABNORMAL LOW (ref >60)
GFR calc Af Amer: 43 mL/min — ABNORMAL LOW (ref >60)
GFR calc Af Amer: 44 mL/min — ABNORMAL LOW (ref >60)
GFR calc Af Amer: 47 mL/min — ABNORMAL LOW (ref >60)
GFR calc Af Amer: 47 mL/min — ABNORMAL LOW (ref >60)
GFR calc Af Amer: 50 mL/min — ABNORMAL LOW (ref >60)
GFR calc Af Amer: >60 mL/min (ref >60)
GFR calc Af Amer: >60 mL/min (ref >60)
GFR calc Af Amer: >60 mL/min (ref >60)
GFR calc Af Amer: >60 mL/min (ref >60)
GFR calc non Af Amer: 31 mL/min — ABNORMAL LOW (ref >60)
GFR calc non Af Amer: 39 mL/min — ABNORMAL LOW (ref >60)
GFR calc non Af Amer: 40 mL/min — ABNORMAL LOW (ref >60)
GFR calc non Af Amer: 41 mL/min — ABNORMAL LOW (ref >60)
GFR calc non Af Amer: 51 mL/min — ABNORMAL LOW (ref >60)
GFR calc non Af Amer: 54 mL/min — ABNORMAL LOW (ref >60)
GFR calc non Af Amer: 59 mL/min — ABNORMAL LOW (ref >60)
GFR calc non Af Amer: >60 mL/min (ref >60)
Glucose, Bld: 103 mg/dL — ABNORMAL HIGH (ref 70–99)
Glucose, Bld: 123 mg/dL — ABNORMAL HIGH (ref 70–99)
Glucose, Bld: 124 mg/dL — ABNORMAL HIGH (ref 70–99)
Glucose, Bld: 134 mg/dL — ABNORMAL HIGH (ref 70–99)
Glucose, Bld: 136 mg/dL — ABNORMAL HIGH (ref 70–99)
Glucose, Bld: 163 mg/dL — ABNORMAL HIGH (ref 70–99)
Glucose, Bld: 175 mg/dL — ABNORMAL HIGH (ref 70–99)
Glucose, Bld: 181 mg/dL — ABNORMAL HIGH (ref 70–99)
Glucose, Bld: 198 mg/dL — ABNORMAL HIGH (ref 70–99)
Glucose, Bld: 225 mg/dL — ABNORMAL HIGH (ref 70–99)
Glucose, Bld: 290 mg/dL — ABNORMAL HIGH (ref 70–99)
Glucose, Bld: 304 mg/dL — ABNORMAL HIGH (ref 70–99)
Glucose, Bld: 95 mg/dL (ref 70–99)
Potassium: 3.1 mEq/L — ABNORMAL LOW (ref 3.5–5.1)
Potassium: 3.3 mEq/L — ABNORMAL LOW (ref 3.5–5.1)
Potassium: 3.4 mEq/L — ABNORMAL LOW (ref 3.5–5.1)
Potassium: 3.4 mEq/L — ABNORMAL LOW (ref 3.5–5.1)
Potassium: 3.6 mEq/L (ref 3.5–5.1)
Potassium: 3.7 mEq/L (ref 3.5–5.1)
Potassium: 3.7 mEq/L (ref 3.5–5.1)
Potassium: 4 mEq/L (ref 3.5–5.1)
Potassium: 4 mEq/L (ref 3.5–5.1)
Potassium: 4.4 mEq/L (ref 3.5–5.1)
Sodium: 138 mEq/L (ref 135–145)
Sodium: 138 mEq/L (ref 135–145)
Sodium: 139 mEq/L (ref 135–145)
Sodium: 140 mEq/L (ref 135–145)
Sodium: 141 mEq/L (ref 135–145)

## 2010-08-19 LAB — POCT I-STAT 3, ART BLOOD GAS (G3+)
Acid-Base Excess: 14 mmol/L — ABNORMAL HIGH (ref 0.0–2.0)
Acid-Base Excess: 17 mmol/L — ABNORMAL HIGH (ref 0.0–2.0)
Acid-Base Excess: 27 mmol/L — ABNORMAL HIGH (ref 0.0–2.0)
Acid-base deficit: 3 mmol/L — ABNORMAL HIGH (ref 0.0–2.0)
Acid-base deficit: 7 mmol/L — ABNORMAL HIGH (ref 0.0–2.0)
Bicarbonate: 17.7 mEq/L — ABNORMAL LOW (ref 20.0–24.0)
Bicarbonate: 22.8 mEq/L (ref 20.0–24.0)
Bicarbonate: 24.7 mEq/L — ABNORMAL HIGH (ref 20.0–24.0)
Bicarbonate: 41.7 mEq/L — ABNORMAL HIGH (ref 20.0–24.0)
Bicarbonate: 49.4 mEq/L — ABNORMAL HIGH (ref 20.0–24.0)
O2 Saturation: 100 %
O2 Saturation: 95 %
O2 Saturation: 96 %
O2 Saturation: 96 %
O2 Saturation: 96 %
Patient temperature: 98.6
Patient temperature: 98.6
Patient temperature: 98.6
Patient temperature: 98.8
Patient temperature: 99.2
TCO2: 18 mmol/L (ref 0–100)
TCO2: 24 mmol/L (ref 0–100)
TCO2: 26 mmol/L (ref 0–100)
TCO2: 27 mmol/L (ref 0–100)
TCO2: 44 mmol/L (ref 0–100)
TCO2: >50 mmol/L (ref 0–100)
pCO2 arterial: 32.4 mmHg — ABNORMAL LOW (ref 35.0–45.0)
pCO2 arterial: 45.4 mmHg — ABNORMAL HIGH (ref 35.0–45.0)
pCO2 arterial: 46.2 mmHg — ABNORMAL HIGH (ref 35.0–45.0)
pCO2 arterial: 60.3 mmHg (ref 35.0–45.0)
pH, Arterial: 7.309 — ABNORMAL LOW (ref 7.350–7.450)
pH, Arterial: 7.317 — ABNORMAL LOW (ref 7.350–7.450)
pH, Arterial: 7.337 — ABNORMAL LOW (ref 7.350–7.450)
pO2, Arterial: 203 mmHg — ABNORMAL HIGH (ref 80.0–100.0)
pO2, Arterial: 214 mmHg — ABNORMAL HIGH (ref 80.0–100.0)
pO2, Arterial: 73 mmHg — ABNORMAL LOW (ref 80.0–100.0)
pO2, Arterial: 78 mmHg — ABNORMAL LOW (ref 80.0–100.0)
pO2, Arterial: 82 mmHg (ref 80.0–100.0)
pO2, Arterial: 83 mmHg (ref 80.0–100.0)
pO2, Arterial: 85 mmHg (ref 80.0–100.0)
pO2, Arterial: 88 mmHg (ref 80.0–100.0)

## 2010-08-19 LAB — DIFFERENTIAL
Basophils Absolute: 0 10*3/uL (ref 0.0–0.1)
Basophils Absolute: 0 10*3/uL (ref 0.0–0.1)
Basophils Absolute: 0.1 10*3/uL (ref 0.0–0.1)
Basophils Absolute: 0.2 10*3/uL — ABNORMAL HIGH (ref 0.0–0.1)
Basophils Relative: 0 % (ref 0–1)
Basophils Relative: 0 % (ref 0–1)
Basophils Relative: 1 % (ref 0–1)
Basophils Relative: 2 % — ABNORMAL HIGH (ref 0–1)
Eosinophils Absolute: 0.1 10*3/uL (ref 0.0–0.7)
Eosinophils Absolute: 0.2 10*3/uL (ref 0.0–0.7)
Eosinophils Absolute: 0.2 10*3/uL (ref 0.0–0.7)
Eosinophils Relative: 1 % (ref 0–5)
Eosinophils Relative: 2 % (ref 0–5)
Eosinophils Relative: 2 % (ref 0–5)
Lymphocytes Relative: 10 % — ABNORMAL LOW (ref 12–46)
Lymphocytes Relative: 10 % — ABNORMAL LOW (ref 12–46)
Lymphocytes Relative: 15 % (ref 12–46)
Lymphs Abs: 0.4 10*3/uL — ABNORMAL LOW (ref 0.7–4.0)
Lymphs Abs: 0.6 10*3/uL — ABNORMAL LOW (ref 0.7–4.0)
Lymphs Abs: 1.5 10*3/uL (ref 0.7–4.0)
Monocytes Absolute: 0.4 10*3/uL (ref 0.1–1.0)
Monocytes Absolute: 0.5 10*3/uL (ref 0.1–1.0)
Monocytes Absolute: 0.6 10*3/uL (ref 0.1–1.0)
Monocytes Absolute: 0.7 10*3/uL (ref 0.1–1.0)
Monocytes Relative: 7 % (ref 3–12)
Monocytes Relative: 7 % (ref 3–12)
Monocytes Relative: 7 % (ref 3–12)
Monocytes Relative: 8 % (ref 3–12)
Neutro Abs: 4.5 10*3/uL (ref 1.7–7.7)
Neutro Abs: 5.1 10*3/uL (ref 1.7–7.7)
Neutro Abs: 9.2 10*3/uL — ABNORMAL HIGH (ref 1.7–7.7)
Neutrophils Relative %: 74 % (ref 43–77)
Neutrophils Relative %: 82 % — ABNORMAL HIGH (ref 43–77)
Neutrophils Relative %: 82 % — ABNORMAL HIGH (ref 43–77)
Neutrophils Relative %: 84 % — ABNORMAL HIGH (ref 43–77)
Neutrophils Relative %: 85 % — ABNORMAL HIGH (ref 43–77)

## 2010-08-19 LAB — CULTURE, BLOOD (ROUTINE X 2): Culture: NO GROWTH

## 2010-08-19 LAB — URINE CULTURE
Colony Count: NO GROWTH
Colony Count: NO GROWTH
Culture: NO GROWTH
Culture: NO GROWTH
Special Requests: NEGATIVE
Special Requests: POSITIVE

## 2010-08-19 LAB — PROTIME-INR
INR: 1.07 (ref 0.00–1.49)
INR: 1.49 (ref 0.00–1.49)
Prothrombin Time: 13.8 seconds (ref 11.6–15.2)

## 2010-08-19 LAB — PREALBUMIN
Prealbumin: 14.4 mg/dL — ABNORMAL LOW (ref 18.0–45.0)
Prealbumin: 4.7 mg/dL — ABNORMAL LOW (ref 18.0–45.0)

## 2010-08-19 LAB — BLOOD GAS, ARTERIAL
Drawn by: 266271
FIO2: 0.4 %
O2 Saturation: 80.1 %
Patient temperature: 96.8
pO2, Arterial: 45.7 mmHg — ABNORMAL LOW (ref 80.0–100.0)

## 2010-08-19 LAB — URINE MICROSCOPIC-ADD ON

## 2010-08-19 LAB — CULTURE, ROUTINE-ABSCESS

## 2010-08-19 LAB — URINALYSIS, MICROSCOPIC ONLY
Bilirubin Urine: NEGATIVE
Hgb urine dipstick: NEGATIVE
Ketones, ur: NEGATIVE mg/dL
Nitrite: NEGATIVE
Urobilinogen, UA: 1 mg/dL (ref 0.0–1.0)

## 2010-08-19 LAB — CULTURE, BAL-QUANTITATIVE W GRAM STAIN

## 2010-08-19 LAB — FUNGUS CULTURE W SMEAR: Fungal Smear: NONE SEEN

## 2010-08-19 LAB — TROPONIN I: Troponin I: 0.02 ng/mL (ref 0.00–0.06)

## 2010-08-19 LAB — CK TOTAL AND CKMB (NOT AT ARMC)
Relative Index: INVALID (ref 0.0–2.5)
Total CK: 46 U/L (ref 7–232)

## 2010-08-19 LAB — MAGNESIUM
Magnesium: 1.4 mg/dL — ABNORMAL LOW (ref 1.5–2.5)
Magnesium: 1.7 mg/dL (ref 1.5–2.5)
Magnesium: 1.8 mg/dL (ref 1.5–2.5)

## 2010-08-19 LAB — POCT I-STAT 4, (NA,K, GLUC, HGB,HCT)
Glucose, Bld: 151 mg/dL — ABNORMAL HIGH (ref 70–99)
HCT: 36 % — ABNORMAL LOW (ref 39.0–52.0)
Hemoglobin: 12.2 g/dL — ABNORMAL LOW (ref 13.0–17.0)

## 2010-08-19 LAB — CHOLESTEROL, TOTAL
Cholesterol: 113 mg/dL (ref 0–200)
Cholesterol: 92 mg/dL (ref 0–200)

## 2010-08-19 LAB — PHOSPHORUS
Phosphorus: 2 mg/dL — ABNORMAL LOW (ref 2.3–4.6)
Phosphorus: 3.1 mg/dL (ref 2.3–4.6)
Phosphorus: 3.2 mg/dL (ref 2.3–4.6)
Phosphorus: 3.7 mg/dL (ref 2.3–4.6)

## 2010-08-19 LAB — CARBOXYHEMOGLOBIN
Carboxyhemoglobin: 1 % (ref 0.5–1.5)
Carboxyhemoglobin: 1.1 % (ref 0.5–1.5)
Carboxyhemoglobin: 1.3 % (ref 0.5–1.5)
Methemoglobin: 0.7 % (ref 0.0–1.5)
Methemoglobin: 1.2 % (ref 0.0–1.5)
O2 Saturation: 63.5 %
O2 Saturation: 69.6 %
O2 Saturation: 71.6 %
Total hemoglobin: 10.5 g/dL — ABNORMAL LOW (ref 13.5–18.0)

## 2010-08-19 LAB — LACTIC ACID, PLASMA
Lactic Acid, Venous: 0.9 mmol/L (ref 0.5–2.2)
Lactic Acid, Venous: 2.5 mmol/L — ABNORMAL HIGH (ref 0.5–2.2)

## 2010-08-19 LAB — MRSA PCR SCREENING
MRSA by PCR: NEGATIVE
MRSA by PCR: NEGATIVE

## 2010-08-19 LAB — CARDIAC PANEL(CRET KIN+CKTOT+MB+TROPI): Troponin I: 0.04 ng/mL (ref 0.00–0.06)

## 2010-08-19 LAB — AFB CULTURE WITH SMEAR (NOT AT ARMC): Acid Fast Smear: NONE SEEN

## 2010-08-19 LAB — BRAIN NATRIURETIC PEPTIDE
Pro B Natriuretic peptide (BNP): 275 pg/mL — ABNORMAL HIGH (ref 0.0–100.0)
Pro B Natriuretic peptide (BNP): 685 pg/mL — ABNORMAL HIGH (ref 0.0–100.0)

## 2010-08-23 LAB — CBC
HCT: 29.2 % — ABNORMAL LOW (ref 39.0–52.0)
HCT: 29.7 % — ABNORMAL LOW (ref 39.0–52.0)
HCT: 29.9 % — ABNORMAL LOW (ref 39.0–52.0)
HCT: 30 % — ABNORMAL LOW (ref 39.0–52.0)
HCT: 30.2 % — ABNORMAL LOW (ref 39.0–52.0)
HCT: 31 % — ABNORMAL LOW (ref 39.0–52.0)
HCT: 34.9 % — ABNORMAL LOW (ref 39.0–52.0)
Hemoglobin: 10.2 g/dL — ABNORMAL LOW (ref 13.0–17.0)
Hemoglobin: 9.8 g/dL — ABNORMAL LOW (ref 13.0–17.0)
Hemoglobin: 9.9 g/dL — ABNORMAL LOW (ref 13.0–17.0)
MCHC: 33 g/dL (ref 30.0–36.0)
MCHC: 33.6 g/dL (ref 30.0–36.0)
MCHC: 32 g/dL (ref 30.0–36.0)
MCHC: 32.9 g/dL (ref 30.0–36.0)
MCHC: 32.9 g/dL (ref 30.0–36.0)
MCHC: 33.2 g/dL (ref 30.0–36.0)
MCV: 90.2 fL (ref 78.0–100.0)
MCV: 90 fL (ref 78.0–100.0)
MCV: 90.5 fL (ref 78.0–100.0)
MCV: 91 fL (ref 78.0–100.0)
MCV: 91.2 fL (ref 78.0–100.0)
Platelets: 205 10*3/uL (ref 150–400)
Platelets: 207 10*3/uL (ref 150–400)
Platelets: 208 10*3/uL (ref 150–400)
Platelets: 215 10*3/uL (ref 150–400)
Platelets: 230 10*3/uL (ref 150–400)
RBC: 3.36 MIL/uL — ABNORMAL LOW (ref 4.22–5.81)
RBC: 3.31 MIL/uL — ABNORMAL LOW (ref 4.22–5.81)
RBC: 3.33 MIL/uL — ABNORMAL LOW (ref 4.22–5.81)
RBC: 3.34 MIL/uL — ABNORMAL LOW (ref 4.22–5.81)
RBC: 3.41 MIL/uL — ABNORMAL LOW (ref 4.22–5.81)
RBC: 3.57 MIL/uL — ABNORMAL LOW (ref 4.22–5.81)
RDW: 16.3 % — ABNORMAL HIGH (ref 11.5–15.5)
RDW: 17.2 % — ABNORMAL HIGH (ref 11.5–15.5)
RDW: 17.3 % — ABNORMAL HIGH (ref 11.5–15.5)
RDW: 17.6 % — ABNORMAL HIGH (ref 11.5–15.5)
WBC: 6.3 10*3/uL (ref 4.0–10.5)
WBC: 7.8 10*3/uL (ref 4.0–10.5)
WBC: 7 10*3/uL (ref 4.0–10.5)
WBC: 7.4 10*3/uL (ref 4.0–10.5)
WBC: 7.8 10*3/uL (ref 4.0–10.5)
WBC: 9.3 10*3/uL (ref 4.0–10.5)

## 2010-08-23 LAB — TROPONIN I: Troponin I: 0.02 ng/mL (ref 0.00–0.06)

## 2010-08-23 LAB — BLOOD GAS, ARTERIAL
Acid-Base Excess: 3.9 mmol/L — ABNORMAL HIGH (ref 0.0–2.0)
Acid-Base Excess: 5.3 mmol/L — ABNORMAL HIGH (ref 0.0–2.0)
Acid-Base Excess: 6.4 mmol/L — ABNORMAL HIGH (ref 0.0–2.0)
Bicarbonate: 32.9 mEq/L — ABNORMAL HIGH (ref 20.0–24.0)
Bicarbonate: 33.1 mEq/L — ABNORMAL HIGH (ref 20.0–24.0)
Drawn by: 307971
O2 Content: 2 L/min
O2 Saturation: 82.5 %
Patient temperature: 98.6
TCO2: 29.2 mmol/L (ref 0–100)
TCO2: 30.9 mmol/L (ref 0–100)
TCO2: 31.1 mmol/L (ref 0–100)
pCO2 arterial: 63.3 mmHg (ref 35.0–45.0)
pCO2 arterial: 68.6 mmHg (ref 35.0–45.0)
pH, Arterial: 7.338 — ABNORMAL LOW (ref 7.350–7.450)
pO2, Arterial: 91.7 mmHg (ref 80.0–100.0)

## 2010-08-23 LAB — BASIC METABOLIC PANEL
BUN: 30 mg/dL — ABNORMAL HIGH (ref 6–23)
BUN: 23 mg/dL (ref 6–23)
BUN: 55 mg/dL — ABNORMAL HIGH (ref 6–23)
CO2: 41 mEq/L (ref 19–32)
CO2: 32 mEq/L (ref 19–32)
Calcium: 7.9 mg/dL — ABNORMAL LOW (ref 8.4–10.5)
Calcium: 8 mg/dL — ABNORMAL LOW (ref 8.4–10.5)
Calcium: 6.9 mg/dL — ABNORMAL LOW (ref 8.4–10.5)
Calcium: 7.6 mg/dL — ABNORMAL LOW (ref 8.4–10.5)
Calcium: 7.7 mg/dL — ABNORMAL LOW (ref 8.4–10.5)
Chloride: 90 mEq/L — ABNORMAL LOW (ref 96–112)
Chloride: 100 mEq/L (ref 96–112)
Chloride: 101 mEq/L (ref 96–112)
Chloride: 92 mEq/L — ABNORMAL LOW (ref 96–112)
Creatinine, Ser: 1.17 mg/dL (ref 0.4–1.5)
Creatinine, Ser: 1.25 mg/dL (ref 0.4–1.5)
Creatinine, Ser: 1.33 mg/dL (ref 0.4–1.5)
Creatinine, Ser: 1.44 mg/dL (ref 0.4–1.5)
Creatinine, Ser: 2.89 mg/dL — ABNORMAL HIGH (ref 0.4–1.5)
Creatinine, Ser: 3.29 mg/dL — ABNORMAL HIGH (ref 0.4–1.5)
Creatinine, Ser: 3.35 mg/dL — ABNORMAL HIGH (ref 0.4–1.5)
GFR calc Af Amer: >60 mL/min (ref >60)
GFR calc Af Amer: 22 mL/min — ABNORMAL LOW (ref >60)
GFR calc Af Amer: 23 mL/min — ABNORMAL LOW (ref >60)
GFR calc Af Amer: 24 mL/min — ABNORMAL LOW (ref >60)
GFR calc Af Amer: 35 mL/min — ABNORMAL LOW (ref >60)
GFR calc non Af Amer: 57 mL/min — ABNORMAL LOW (ref >60)
GFR calc non Af Amer: >60 mL/min (ref >60)
GFR calc non Af Amer: 18 mL/min — ABNORMAL LOW (ref >60)
GFR calc non Af Amer: 19 mL/min — ABNORMAL LOW (ref >60)
GFR calc non Af Amer: 20 mL/min — ABNORMAL LOW (ref >60)
GFR calc non Af Amer: 22 mL/min — ABNORMAL LOW (ref >60)
Glucose, Bld: 141 mg/dL — ABNORMAL HIGH (ref 70–99)
Glucose, Bld: 153 mg/dL — ABNORMAL HIGH (ref 70–99)
Glucose, Bld: 242 mg/dL — ABNORMAL HIGH (ref 70–99)
Potassium: 3.2 mEq/L — ABNORMAL LOW (ref 3.5–5.1)
Potassium: 3.8 mEq/L (ref 3.5–5.1)
Potassium: 4.5 mEq/L (ref 3.5–5.1)
Sodium: 137 mEq/L (ref 135–145)
Sodium: 131 mEq/L — ABNORMAL LOW (ref 135–145)
Sodium: 136 mEq/L (ref 135–145)
Sodium: 137 mEq/L (ref 135–145)

## 2010-08-23 LAB — HEMOGLOBIN A1C
Hgb A1c MFr Bld: 8.1 % — ABNORMAL HIGH (ref 4.6–6.1)
Mean Plasma Glucose: 186 mg/dL

## 2010-08-23 LAB — DIFFERENTIAL
Basophils Relative: 0 % (ref 0–1)
Eosinophils Absolute: 0.1 10*3/uL (ref 0.0–0.7)
Eosinophils Relative: 1 % (ref 0–5)
Lymphs Abs: 2.1 10*3/uL (ref 0.7–4.0)
Monocytes Relative: 3 % (ref 3–12)
Neutrophils Relative %: 73 % (ref 43–77)

## 2010-08-23 LAB — GLUCOSE, CAPILLARY
Glucose-Capillary: 135 mg/dL — ABNORMAL HIGH (ref 70–99)
Glucose-Capillary: 136 mg/dL — ABNORMAL HIGH (ref 70–99)
Glucose-Capillary: 152 mg/dL — ABNORMAL HIGH (ref 70–99)
Glucose-Capillary: 190 mg/dL — ABNORMAL HIGH (ref 70–99)
Glucose-Capillary: 213 mg/dL — ABNORMAL HIGH (ref 70–99)
Glucose-Capillary: 224 mg/dL — ABNORMAL HIGH (ref 70–99)
Glucose-Capillary: 228 mg/dL — ABNORMAL HIGH (ref 70–99)
Glucose-Capillary: 280 mg/dL — ABNORMAL HIGH (ref 70–99)
Glucose-Capillary: 304 mg/dL — ABNORMAL HIGH (ref 70–99)
Glucose-Capillary: 130 mg/dL — ABNORMAL HIGH (ref 70–99)
Glucose-Capillary: 140 mg/dL — ABNORMAL HIGH (ref 70–99)
Glucose-Capillary: 164 mg/dL — ABNORMAL HIGH (ref 70–99)
Glucose-Capillary: 167 mg/dL — ABNORMAL HIGH (ref 70–99)
Glucose-Capillary: 174 mg/dL — ABNORMAL HIGH (ref 70–99)
Glucose-Capillary: 176 mg/dL — ABNORMAL HIGH (ref 70–99)
Glucose-Capillary: 205 mg/dL — ABNORMAL HIGH (ref 70–99)
Glucose-Capillary: 205 mg/dL — ABNORMAL HIGH (ref 70–99)
Glucose-Capillary: 205 mg/dL — ABNORMAL HIGH (ref 70–99)
Glucose-Capillary: 207 mg/dL — ABNORMAL HIGH (ref 70–99)
Glucose-Capillary: 209 mg/dL — ABNORMAL HIGH (ref 70–99)
Glucose-Capillary: 215 mg/dL — ABNORMAL HIGH (ref 70–99)
Glucose-Capillary: 225 mg/dL — ABNORMAL HIGH (ref 70–99)
Glucose-Capillary: 225 mg/dL — ABNORMAL HIGH (ref 70–99)
Glucose-Capillary: 229 mg/dL — ABNORMAL HIGH (ref 70–99)
Glucose-Capillary: 238 mg/dL — ABNORMAL HIGH (ref 70–99)
Glucose-Capillary: 268 mg/dL — ABNORMAL HIGH (ref 70–99)

## 2010-08-23 LAB — COMPREHENSIVE METABOLIC PANEL
ALT: 15 U/L (ref 0–53)
ALT: 17 U/L (ref 0–53)
AST: 25 U/L (ref 0–37)
AST: 30 U/L (ref 0–37)
Alkaline Phosphatase: 84 U/L (ref 39–117)
BUN: 56 mg/dL — ABNORMAL HIGH (ref 6–23)
CO2: 31 mEq/L (ref 19–32)
Calcium: 6.7 mg/dL — ABNORMAL LOW (ref 8.4–10.5)
Creatinine, Ser: 3.52 mg/dL — ABNORMAL HIGH (ref 0.4–1.5)
GFR calc non Af Amer: 17 mL/min — ABNORMAL LOW (ref >60)
GFR calc non Af Amer: 38 mL/min — ABNORMAL LOW (ref >60)
Glucose, Bld: 177 mg/dL — ABNORMAL HIGH (ref 70–99)
Potassium: 3.7 mEq/L (ref 3.5–5.1)
Potassium: 4.4 mEq/L (ref 3.5–5.1)
Sodium: 137 mEq/L (ref 135–145)
Total Bilirubin: 0.8 mg/dL (ref 0.3–1.2)
Total Protein: 5.4 g/dL — ABNORMAL LOW (ref 6.0–8.3)
Total Protein: 5.7 g/dL — ABNORMAL LOW (ref 6.0–8.3)

## 2010-08-23 LAB — BRAIN NATRIURETIC PEPTIDE
Pro B Natriuretic peptide (BNP): 92 pg/mL (ref 0.0–100.0)
Pro B Natriuretic peptide (BNP): 98 pg/mL (ref 0.0–100.0)
Pro B Natriuretic peptide (BNP): 262 pg/mL — ABNORMAL HIGH (ref 0.0–100.0)
Pro B Natriuretic peptide (BNP): 49.5 pg/mL (ref 0.0–100.0)

## 2010-08-23 LAB — PROTIME-INR
INR: 1.16 (ref 0.00–1.49)
INR: 1.48 (ref 0.00–1.49)
Prothrombin Time: 30.8 seconds — ABNORMAL HIGH (ref 11.6–15.2)

## 2010-08-23 LAB — CK TOTAL AND CKMB (NOT AT ARMC)
CK, MB: 1.2 ng/mL (ref 0.3–4.0)
Relative Index: INVALID (ref 0.0–2.5)

## 2010-08-23 LAB — HEPARIN LEVEL (UNFRACTIONATED)
Heparin Unfractionated: 0.53 IU/mL (ref 0.30–0.70)
Heparin Unfractionated: 0.55 IU/mL (ref 0.30–0.70)
Heparin Unfractionated: 0.68 IU/mL (ref 0.30–0.70)

## 2010-08-23 LAB — MRSA PCR SCREENING: MRSA by PCR: NEGATIVE

## 2010-08-23 LAB — VANCOMYCIN, TROUGH: Vancomycin Tr: 35.2 ug/mL (ref 10.0–20.0)

## 2010-09-13 ENCOUNTER — Emergency Department (HOSPITAL_COMMUNITY)
Admission: EM | Admit: 2010-09-13 | Discharge: 2010-09-14 | Disposition: A | Discharge status: Home / Self Care | Payer: BC Managed Care – PPO | Attending: Emergency Medicine | Admitting: Emergency Medicine

## 2010-09-13 DIAGNOSIS — G459 Transient cerebral ischemic attack, unspecified: Secondary | ICD-10-CM | POA: Insufficient documentation

## 2010-09-13 DIAGNOSIS — S8010XA Contusion of unspecified lower leg, initial encounter: Secondary | ICD-10-CM | POA: Insufficient documentation

## 2010-09-13 DIAGNOSIS — I10 Essential (primary) hypertension: Secondary | ICD-10-CM | POA: Insufficient documentation

## 2010-09-13 DIAGNOSIS — Z7901 Long term (current) use of anticoagulants: Secondary | ICD-10-CM | POA: Insufficient documentation

## 2010-09-13 DIAGNOSIS — R4182 Altered mental status, unspecified: Secondary | ICD-10-CM | POA: Insufficient documentation

## 2010-09-13 DIAGNOSIS — Y92410 Unspecified street and highway as the place of occurrence of the external cause: Secondary | ICD-10-CM | POA: Insufficient documentation

## 2010-09-13 DIAGNOSIS — Z794 Long term (current) use of insulin: Secondary | ICD-10-CM | POA: Insufficient documentation

## 2010-09-13 DIAGNOSIS — E119 Type 2 diabetes mellitus without complications: Secondary | ICD-10-CM | POA: Insufficient documentation

## 2010-09-13 HISTORY — DX: Transient cerebral ischemic attack, unspecified: G45.9

## 2010-09-13 LAB — GLUCOSE, CAPILLARY

## 2010-09-14 ENCOUNTER — Encounter (HOSPITAL_COMMUNITY): Payer: Self-pay

## 2010-09-14 ENCOUNTER — Emergency Department (HOSPITAL_COMMUNITY): Payer: BC Managed Care – PPO

## 2010-09-14 LAB — CK TOTAL AND CKMB (NOT AT ARMC): CK, MB: 1.9 ng/mL (ref 0.3–4.0)

## 2010-09-14 LAB — DIFFERENTIAL
Eosinophils Absolute: 0 10*3/uL (ref 0.0–0.7)
Eosinophils Relative: 0 % (ref 0–5)
Lymphocytes Relative: 21 % (ref 12–46)
Lymphs Abs: 1.6 10*3/uL (ref 0.7–4.0)
Monocytes Absolute: 0.7 10*3/uL (ref 0.1–1.0)

## 2010-09-14 LAB — COMPREHENSIVE METABOLIC PANEL
ALT: 16 U/L (ref 0–53)
AST: 24 U/L (ref 0–37)
Albumin: 3.7 g/dL (ref 3.5–5.2)
Alkaline Phosphatase: 70 U/L (ref 39–117)
CO2: 31 mEq/L (ref 19–32)
Chloride: 100 mEq/L (ref 96–112)
GFR calc Af Amer: 56 mL/min — ABNORMAL LOW (ref >60)
Potassium: 3.6 mEq/L (ref 3.5–5.1)
Sodium: 141 mEq/L (ref 135–145)
Total Bilirubin: 0.9 mg/dL (ref 0.3–1.2)

## 2010-09-14 LAB — CBC
HCT: 40.4 % (ref 39.0–52.0)
MCHC: 32.4 g/dL (ref 30.0–36.0)
MCV: 88.8 fL (ref 78.0–100.0)
Platelets: 195 10*3/uL (ref 150–400)
RDW: 17.5 % — ABNORMAL HIGH (ref 11.5–15.5)

## 2010-09-20 ENCOUNTER — Other Ambulatory Visit: Payer: Self-pay | Admitting: Internal Medicine

## 2010-09-20 DIAGNOSIS — R4182 Altered mental status, unspecified: Secondary | ICD-10-CM

## 2010-09-26 ENCOUNTER — Ambulatory Visit
Admission: RE | Admit: 2010-09-26 | Discharge: 2010-09-26 | Disposition: A | Discharge status: Home / Self Care | Payer: BC Managed Care – PPO | Source: Ambulatory Visit | Attending: Internal Medicine | Admitting: Internal Medicine

## 2010-09-26 DIAGNOSIS — R4182 Altered mental status, unspecified: Secondary | ICD-10-CM

## 2010-11-17 ENCOUNTER — Encounter (INDEPENDENT_AMBULATORY_CARE_PROVIDER_SITE_OTHER): Payer: Self-pay | Admitting: General Surgery

## 2011-02-04 ENCOUNTER — Encounter (HOSPITAL_BASED_OUTPATIENT_CLINIC_OR_DEPARTMENT_OTHER): Payer: BC Managed Care – PPO | Attending: General Surgery

## 2011-02-04 DIAGNOSIS — E785 Hyperlipidemia, unspecified: Secondary | ICD-10-CM | POA: Insufficient documentation

## 2011-02-04 DIAGNOSIS — E119 Type 2 diabetes mellitus without complications: Secondary | ICD-10-CM | POA: Insufficient documentation

## 2011-02-04 DIAGNOSIS — Y838 Other surgical procedures as the cause of abnormal reaction of the patient, or of later complication, without mention of misadventure at the time of the procedure: Secondary | ICD-10-CM | POA: Insufficient documentation

## 2011-02-04 DIAGNOSIS — M069 Rheumatoid arthritis, unspecified: Secondary | ICD-10-CM | POA: Insufficient documentation

## 2011-02-04 DIAGNOSIS — Z86718 Personal history of other venous thrombosis and embolism: Secondary | ICD-10-CM | POA: Insufficient documentation

## 2011-02-04 DIAGNOSIS — Z79899 Other long term (current) drug therapy: Secondary | ICD-10-CM | POA: Insufficient documentation

## 2011-02-04 DIAGNOSIS — I10 Essential (primary) hypertension: Secondary | ICD-10-CM | POA: Insufficient documentation

## 2011-02-04 DIAGNOSIS — T8189XA Other complications of procedures, not elsewhere classified, initial encounter: Secondary | ICD-10-CM | POA: Insufficient documentation

## 2011-02-04 DIAGNOSIS — D869 Sarcoidosis, unspecified: Secondary | ICD-10-CM | POA: Insufficient documentation

## 2011-02-04 DIAGNOSIS — Z7901 Long term (current) use of anticoagulants: Secondary | ICD-10-CM | POA: Insufficient documentation

## 2011-02-15 ENCOUNTER — Encounter (INDEPENDENT_AMBULATORY_CARE_PROVIDER_SITE_OTHER): Payer: Self-pay | Admitting: General Surgery

## 2011-02-16 ENCOUNTER — Ambulatory Visit (INDEPENDENT_AMBULATORY_CARE_PROVIDER_SITE_OTHER): Payer: BC Managed Care – PPO | Admitting: General Surgery

## 2011-02-16 ENCOUNTER — Encounter (INDEPENDENT_AMBULATORY_CARE_PROVIDER_SITE_OTHER): Payer: Self-pay | Admitting: General Surgery

## 2011-02-16 VITALS — BP 134/82 | HR 60 | Temp 97.8°F | Resp 16 | Ht 73.0 in | Wt 261.0 lb

## 2011-02-16 DIAGNOSIS — K432 Incisional hernia without obstruction or gangrene: Secondary | ICD-10-CM

## 2011-02-16 NOTE — Progress Notes (Signed)
Subjective:     Patient ID: Fred Alvarez, male   DOB: [birth year], 70 y.o.   MRN: [id number]  HPI Patient presents for followup. He has a large incisional hernia status post repair of perforated duodenum. He's been going to the wound care center for help with healing his midline skin wound. He has been eating well and moving his bowels without difficulty. He is having no pain from his hernia. He wears an abdominal binder.  Review of Systems     Objective:   Physical Exam  Eyes: Conjunctivae are normal. Pupils are equal, round, and reactive to light.  Neck: Normal range of motion. Neck supple.  Cardiovascular: Normal rate and normal heart sounds.   Pulmonary/Chest: Effort normal and breath sounds normal.  Abdominal: Soft.  Large incisional hernia remains. This is easily to reduce. There is no tenderness. He has a special dressing from the wound care center over his midline wound.     Assessment:     Large incisional hernia status post repair of perforated duodenum    Plan:     Continue wound care at the wound center. I will see him back in 2 months. We will reevaluate at that time for possible hernia repair. He is at some increased risk due to his immunosuppressive therapy for his arthritis. The patient also reports that his primary care physician had some concerns about proceeding with surgery at this time. We will see him the patient does for the next 2 months and reevaluate at that time.

## 2011-02-28 ENCOUNTER — Encounter (INDEPENDENT_AMBULATORY_CARE_PROVIDER_SITE_OTHER): Payer: Self-pay | Admitting: General Surgery

## 2011-03-04 ENCOUNTER — Encounter (HOSPITAL_BASED_OUTPATIENT_CLINIC_OR_DEPARTMENT_OTHER): Payer: BC Managed Care – PPO | Attending: General Surgery

## 2011-03-04 DIAGNOSIS — T8189XA Other complications of procedures, not elsewhere classified, initial encounter: Secondary | ICD-10-CM | POA: Insufficient documentation

## 2011-03-04 DIAGNOSIS — E785 Hyperlipidemia, unspecified: Secondary | ICD-10-CM | POA: Insufficient documentation

## 2011-03-04 DIAGNOSIS — Z7901 Long term (current) use of anticoagulants: Secondary | ICD-10-CM | POA: Insufficient documentation

## 2011-03-04 DIAGNOSIS — M069 Rheumatoid arthritis, unspecified: Secondary | ICD-10-CM | POA: Insufficient documentation

## 2011-03-04 DIAGNOSIS — E119 Type 2 diabetes mellitus without complications: Secondary | ICD-10-CM | POA: Insufficient documentation

## 2011-03-04 DIAGNOSIS — Z79899 Other long term (current) drug therapy: Secondary | ICD-10-CM | POA: Insufficient documentation

## 2011-03-04 DIAGNOSIS — I10 Essential (primary) hypertension: Secondary | ICD-10-CM | POA: Insufficient documentation

## 2011-03-04 DIAGNOSIS — D869 Sarcoidosis, unspecified: Secondary | ICD-10-CM | POA: Insufficient documentation

## 2011-03-04 DIAGNOSIS — Y838 Other surgical procedures as the cause of abnormal reaction of the patient, or of later complication, without mention of misadventure at the time of the procedure: Secondary | ICD-10-CM | POA: Insufficient documentation

## 2011-03-04 DIAGNOSIS — Z86718 Personal history of other venous thrombosis and embolism: Secondary | ICD-10-CM | POA: Insufficient documentation

## 2011-04-01 ENCOUNTER — Encounter (HOSPITAL_BASED_OUTPATIENT_CLINIC_OR_DEPARTMENT_OTHER): Payer: BC Managed Care – PPO | Attending: General Surgery

## 2011-04-01 DIAGNOSIS — K439 Ventral hernia without obstruction or gangrene: Secondary | ICD-10-CM | POA: Insufficient documentation

## 2011-04-01 DIAGNOSIS — L98499 Non-pressure chronic ulcer of skin of other sites with unspecified severity: Secondary | ICD-10-CM | POA: Insufficient documentation

## 2011-04-08 NOTE — H&P (Signed)
NAME:  Fred Alvarez, Fred Alvarez NO.:  [id number]  MEDICAL RECORD NO.:  [id number]  LOCATION:  FOOT                         FACILITY:  [facility]  PHYSICIAN:  Ardath Sax, M.D.     DATE OF BIRTH:  04/01/1941  DATE OF ADMISSION:  04/01/2011 DATE OF DISCHARGE:                             HISTORY & PHYSICAL   He is a 70 year old man who had to have surgery for a perforated duodenal ulcer, and at that time after surgery he developed a dehiscence of the wound and secondary infection, and he now has a huge ventral hernia with open granulation tissue that is paper thin over the bowel. It appears to be intact without any communication to the peritoneal cavity or any further drainage from infection.  There is open granulation tissue that would respond very well to Oasis biologic dressings, which will help promote fiber blasts and skin growth over this large abdominal wound.  I think it is imperative that we be fairly aggressive on this as there is a very thin layer of granulation tissue covering the bowel, so we will put in for Oasis and plan on doing this next week.     Ardath Sax, M.D.     PP/MEDQ  D:  04/08/2011  T:  04/08/2011  Job:  [id number]

## 2011-04-13 ENCOUNTER — Ambulatory Visit (INDEPENDENT_AMBULATORY_CARE_PROVIDER_SITE_OTHER): Payer: BC Managed Care – PPO | Admitting: General Surgery

## 2011-04-13 ENCOUNTER — Encounter (INDEPENDENT_AMBULATORY_CARE_PROVIDER_SITE_OTHER): Payer: Self-pay | Admitting: General Surgery

## 2011-04-13 VITALS — BP 134/86 | HR 60 | Temp 97.6°F | Resp 18 | Ht 73.0 in | Wt 266.4 lb

## 2011-04-13 DIAGNOSIS — K432 Incisional hernia without obstruction or gangrene: Secondary | ICD-10-CM

## 2011-04-13 NOTE — Progress Notes (Signed)
Subjective:     Patient ID: Fred Alvarez, male   DOB: 06-10-1940, 70 y.o.   MRN: [id number]  HPI  Patient presents for followup of chronic abdominal wound and incisional hernia. He is undergoing treatment at the wound care center for his abdominal wound. He is a delayed wound healing complicated by his need for steroid treatment for rheumatoid arthritis. There using a were seen dermis matrix to assist with his wound healing with treatments at the wound care center. He is having some occasional discomfort from his hernia. However he's been eating fine and moving his bowels without difficulty. He continues to wear an abdominal binder for support.Review of Systems     Objective:   Physical Exam  Cardiovascular: Normal rate, regular rhythm and normal heart sounds.   Pulmonary/Chest: Effort normal and breath sounds normal. No respiratory distress. He has no wheezes.  Abdomen has a specialized dressing over his midline wound. His incisional hernia remains large. It is soft and easily reducible with no pain. Bowel sounds are active.     Assessment:     Large incisional hernia    Plan:     Patient will continue treatments at the wound care center. In order to plan for repairing his hernia, it would be very advantageous to have a skin healed in order to decreased chance of infection as mesh will need to be used. This was discussed in detail with patient. Once we can get his wound closer to being healed, will evaluate things further with his hernia using CT scan. We will make plans going forward. This will be a large operation and we discussed things in some detail at this visit. He will also have to come off his Coumadin to have surgery. We'll see him back.

## 2011-04-13 NOTE — Patient Instructions (Signed)
Continue treatment at the wound care center. Continue abdominal binder for support.

## 2011-04-29 NOTE — Progress Notes (Signed)
[facility]  NAME:  IVIN, ROSENBLOOM                    ACCOUNT NO.:  MEDICAL RECORD NO.:  [id number]      DATE OF BIRTH:  04/17/1941  PHYSICIAN:  Wayland Denis, DO       VISIT DATE:  04/27/2011                                  OFFICE VISIT   Mr. Dung is a 70 year old gentleman who is here for followup on his abdominal wall ulcer.  He has been using Oasis in the past and then most recently collagen.  It seems to be granulating and epithelializing.  It is much smaller than it was originally based on the history and the notes.  There has been no change in his medications.  He did well over the holidays.  His pain is much improved.  He still has a huge hernia. He has a followup appointment with  Dr. Violeta Gelinas in February, at which time, they will consider repair of the hernia and possible procedure, which he describes as a component separation.  On exam, he is alert, oriented, cooperative, not in any acute distress. He is pleasant.  Pupils are equal.  Extraocular muscles are intact.  No cervical lymphadenopathy.  His breathing is unlabored.  His pulse is regular.  The wound is as described, and he does have some dry skin. Large abdomen but soft, no rebound tenderness.  Recommend continuing with the collagen and have him follow up in 1 week, and certainly, we are available for repair and assistance with component separation if needed.     Wayland Denis, DO     CS/MEDQ  D:  04/27/2011  T:  04/27/2011  Job:  [id number]

## 2011-05-04 ENCOUNTER — Encounter (HOSPITAL_BASED_OUTPATIENT_CLINIC_OR_DEPARTMENT_OTHER): Payer: BC Managed Care – PPO | Attending: General Surgery

## 2011-05-04 DIAGNOSIS — L98499 Non-pressure chronic ulcer of skin of other sites with unspecified severity: Secondary | ICD-10-CM | POA: Insufficient documentation

## 2011-05-05 NOTE — Progress Notes (Signed)
[facility]  NAME:  ALEM, FAHL NO.:  [id number]  MEDICAL RECORD NO.:  [id number]      DATE OF BIRTH:  [birth year]  PHYSICIAN:  Wayland Denis, DO       VISIT DATE:  05/04/2011                                  OFFICE VISIT   HISTORY:  Mr. Landin is a 70 year old gentleman who is here for followup on his abdominal ulcer.  He has been using collagen, Hydrogel with an ABD.  There is some fibrous tissue over it but no significant change otherwise.  He does not complain of any pain.  There has been no change in his medications or social history.  PHYSICAL EXAMINATION:  GENERAL:  He is alert, oriented, cooperative, not in any acute distress.  He is pleasant. EYES:  Pupils are equal.  Extraocular muscles are intact. NECK:  No cervical lymphadenopathy. LUNGS:  His breathing is unlabored. HEART:  His heart rate is regular.  PLAN:  Debridement was done and noted in the nurses' note, and we are going to add Santyl with collagen, Adaptic, and ABD.  His appointment with general surgery is in January, so we will see what they say, but we will see him back in followup in the next week.     Wayland Denis, DO     CS/MEDQ  D:  05/04/2011  T:  05/05/2011  Job:  [id number]

## 2011-05-06 ENCOUNTER — Encounter (HOSPITAL_BASED_OUTPATIENT_CLINIC_OR_DEPARTMENT_OTHER): Payer: BC Managed Care – PPO

## 2011-05-11 NOTE — Progress Notes (Signed)
[facility]  NAME:  LYNDLE, PANG NO.:  [id number]  MEDICAL RECORD NO.:  [id number]      DATE OF BIRTH:  1941/02/06  PHYSICIAN:  Wayland Denis, DO            VISIT DATE:                                  OFFICE VISIT   HISTORY:  Mr. Rosendahl a 70 year old white male here for followup on abdominal ulcer.  He was using collagen over the past week with some improvement.  The periwound area is a little bit red, but the drainage is as expected.  No sign of infection at present.  MEDICATIONS:  There has been no change in his medications.  REVIEW OF SYSTEMS:  Negative.  SOCIAL HISTORY:  Unchanged.  PHYSICAL EXAMINATION:  GENERAL:  He is alert, oriented, and cooperative. He is very pleasant. HEENT:  Pupils are equal.  Extraocular muscles are intact. NECK:  No cervical lymphadenopathy. LUNGS:  His breathing is unlabored. HEART:  Regular. ABDOMEN:  He has a bit of fibrous tissue that was debrided and that is noted in the chart.  He will continue with local wound care, but we will go with Aquacel-AG, zinc tape, and ABD.  I did also speak with Dr. Janee Morn and let him know that I would be happy to help with the surgery if needed, and he is aware.  We will see him back in 1 week.     Wayland Denis, DO     CS/MEDQ  D:  05/11/2011  T:  05/11/2011  Job:  [id number]

## 2011-05-17 ENCOUNTER — Other Ambulatory Visit: Payer: Self-pay | Admitting: Internal Medicine

## 2011-05-17 DIAGNOSIS — R509 Fever, unspecified: Secondary | ICD-10-CM

## 2011-05-18 ENCOUNTER — Telehealth (INDEPENDENT_AMBULATORY_CARE_PROVIDER_SITE_OTHER): Payer: Self-pay | Admitting: General Surgery

## 2011-05-19 ENCOUNTER — Ambulatory Visit
Admission: RE | Admit: 2011-05-19 | Discharge: 2011-05-19 | Disposition: A | Discharge status: Home / Self Care | Payer: BC Managed Care – PPO | Source: Ambulatory Visit | Attending: Internal Medicine | Admitting: Internal Medicine

## 2011-05-19 DIAGNOSIS — R509 Fever, unspecified: Secondary | ICD-10-CM

## 2011-05-19 NOTE — Progress Notes (Signed)
[facility]  NAME:  Fred Alvarez, Fred Alvarez NO.:  [id number]  MEDICAL RECORD NO.:  [id number]      DATE OF BIRTH:  1940-07-03  PHYSICIAN:  Wayland Denis, DO            VISIT DATE:                                  OFFICE VISIT   Fred Alvarez is a 70 year old gentleman who is here for followup on his abdominal ulcer.  He has been using Aquacel Ag with ABD, zinc, and tape and is actually improving.  I did some more debridement today and that is noted in the nurse's note, and he has a followup appointment with General Surgery early January.  He also has had some episodes of fevers, it is unexplained, and therefore the decision was made by his primary care doctor to evaluate his GI system with a CT with oral contrast, which is scheduled for tomorrow and he will let us know after he has that.  He is also going to let Dr. Janee Morn know that he is having that done.  There is no other change.  Social history is unchanged.  MEDICATIONS:  Unchanged.  On exam, he is alert, oriented, cooperative, not in any acute distress. He is pleasant.  Pupils are equal.  Extraocular muscles are intact.  No cervical lymphadenopathy.  His breathing is unlabored.  His heart is regular.  His abdomen is soft, and the wound as noted in the nurse's note with some mild improvement.  We will continue with Aquacel Ag, ABDs, and have him follow up in 1-2 weeks.     Wayland Denis, DO     CS/MEDQ  D:  05/18/2011  T:  05/19/2011  Job:  [id number]

## 2011-06-01 ENCOUNTER — Encounter (HOSPITAL_BASED_OUTPATIENT_CLINIC_OR_DEPARTMENT_OTHER): Payer: BC Managed Care – PPO | Attending: Plastic Surgery

## 2011-06-01 DIAGNOSIS — K469 Unspecified abdominal hernia without obstruction or gangrene: Secondary | ICD-10-CM | POA: Insufficient documentation

## 2011-06-01 DIAGNOSIS — L98499 Non-pressure chronic ulcer of skin of other sites with unspecified severity: Secondary | ICD-10-CM | POA: Insufficient documentation

## 2011-06-02 NOTE — Progress Notes (Signed)
[facility]  NAME:  Fred Alvarez, Fred Alvarez NO.:  [id number]  MEDICAL RECORD NO.:  [id number]      DATE OF BIRTH:  1940/11/26  PHYSICIAN:  Wayland Denis, DO       VISIT DATE:  06/01/2011                                  OFFICE VISIT   Fred Alvarez is a 71 year old gentleman who is here for followup on his abdominal ulceration.  He has been using collagen with some improvement. He is getting some epithelializations from the sides and responds well to the debridement.  We did do debridement today as well and the hemostasis was achieved with pressure and he did well with that.  There has been no change in his medications.  REVIEW OF SYSTEMS:  Negative.  No change in his social history.  PHYSICAL EXAMINATION:  GENERAL:  He is alert, oriented, cooperative, not in any acute distress.  Glasses are in place. HEENT:  Pupils are equal.  Extraocular muscles are intact.  No cervical lymphadenopathy.  His breathing is unlabored. HEART:  Regular. ABDOMEN:  Still has a large hernia, but the wound is improved with epithelialization slowly occurring at the edges.  It does not appear to be infected.  We will continue with collagen and see what is decided with his general surgery appointment next week.  Hopefully, we can join in on the hernia repair and cut the ulceration out with primary repair.  In the meantime, multivitamins, healthy eating, no smoking, collagen, and follow up in 1 week.     Wayland Denis, DO     CS/MEDQ  D:  06/01/2011  T:  06/02/2011  Job:  [id number]

## 2011-06-08 ENCOUNTER — Encounter (INDEPENDENT_AMBULATORY_CARE_PROVIDER_SITE_OTHER): Payer: Self-pay | Admitting: General Surgery

## 2011-06-08 ENCOUNTER — Ambulatory Visit (INDEPENDENT_AMBULATORY_CARE_PROVIDER_SITE_OTHER): Payer: BC Managed Care – PPO | Admitting: General Surgery

## 2011-06-08 VITALS — BP 142/88 | HR 72 | Temp 97.2°F | Resp 18 | Ht 73.0 in | Wt 252.2 lb

## 2011-06-08 DIAGNOSIS — K432 Incisional hernia without obstruction or gangrene: Secondary | ICD-10-CM | POA: Insufficient documentation

## 2011-06-08 NOTE — Progress Notes (Signed)
Subjective:     Patient ID: Fred Alvarez, male   DOB: 1940/06/01, 71 y.o.   MRN: [id number]  HPI Patient has a large incisional hernia after duodenal perforation. He's had wound healing complications and is being treated at the wound care center. He saw Dr.Sanger there. She spoke to him about some possibility of a combined procedure to repair his hernia and excise this nonhealing tissue on his abdominal wall. He's eating and moving his bowels well. He was having flareups of fever and rheumatoid arthritis symptoms. He was put on a prednisone taper and this has resolved. During that time, however, his primary care physician ordered a CT scan of his abdomen and pelvis to make sure this didn't show any infection in his abdomen. It did not. It showed large anterior abdominal wall hernia with small bowel and transverse colon no evidence of bowel obstruction or abscess.  Review of Systems     Objective:   Physical Exam  Cardiovascular: Normal rate, regular rhythm and normal heart sounds.   Pulmonary/Chest: Effort normal and breath sounds normal.  Abdomen is soft, there is no tenderness, his hernia is larger. It does reduce however I'm concerned he may have lost some domain. The area of opening wound is about the same size but is clean with granulation tissue     Assessment:     Large incisional hernia with wound healing issues    Plan:     The patient is increasingly symptomatic with this hernia. He is at increased risk if we were to undertake a large repair due to his immunocompromised status. We discussed this in detail. He is very interested in trying to have the hernia fixed. I discussed things with Dr.Sanger and make a plan. I will call him thereafter.

## 2011-06-10 ENCOUNTER — Other Ambulatory Visit: Payer: Self-pay | Admitting: General Surgery

## 2011-06-30 NOTE — Progress Notes (Signed)
[facility]  NAME:  Fred Alvarez, Fred Alvarez                    ACCOUNT NO.:  MEDICAL RECORD NO.:  [id number]      DATE OF BIRTH:  [birth year]  PHYSICIAN:  Wayland Denis, DO       VISIT DATE:  06/29/2011                                  OFFICE VISIT   Fred Alvarez is a 71 year old male who is here for followup on his abdominal ulcer.  He has been using collagen.  There is a little bit of improvement, but not significant.  It is certainly not any worse.  The tissue bed looks healthier with more granulation tissue, but the size is about the same with just a slight bit of improvement.  There has been no change in his medications or social history.  On exam, he is alert, oriented, cooperative.  He is very pleasant.  His pupils are equal.  There is no cervical lymphadenopathy.  His breathing is unlabored.  His heart is regular.  His abdomen is soft.  His wound is noted in the nurse's notes and as described.  Debridement was done to help clear off some of the fibrous tissue and it was oozier and bled more than usual.  So, overall the fibrous tissue is thinning out.  We do have plans for operating room to work with General Surgery for reduction of his hernia and possible chronic separation in order to improve his abdominal wall and this is scheduled in February.  He should continue with the collagen in the meantime, and I will see him back in 2 weeks.     Wayland Denis, DO     CS/MEDQ  D:  06/29/2011  T:  06/29/2011  Job:  [id number]

## 2011-07-06 ENCOUNTER — Encounter (HOSPITAL_BASED_OUTPATIENT_CLINIC_OR_DEPARTMENT_OTHER): Payer: BC Managed Care – PPO

## 2011-07-13 ENCOUNTER — Encounter (HOSPITAL_BASED_OUTPATIENT_CLINIC_OR_DEPARTMENT_OTHER): Payer: BC Managed Care – PPO | Attending: Plastic Surgery

## 2011-07-13 DIAGNOSIS — L98499 Non-pressure chronic ulcer of skin of other sites with unspecified severity: Secondary | ICD-10-CM | POA: Insufficient documentation

## 2011-07-14 NOTE — Progress Notes (Signed)
[facility]  NAME:  Fred Alvarez NO.:  [id number]  MEDICAL RECORD NO.:  [id number]      DATE OF BIRTH:  December 03, 1940  PHYSICIAN:  Wayland Denis, DO       VISIT DATE:  07/13/2011                                  OFFICE VISIT   Mr. Fred Alvarez is a 71 year old gentleman here for a followup on his abdominal ulcer.  He has been using collagen and it actually is getting smaller and granulating.  I think it would take a very long time for this to close primarily.  He does have plans to go to the OR in the next few weeks.  There has been no change in his medications.  Review of systems is negative.  On exam, he is alert, oriented, cooperative, and not in any acute distress.  His pupils are equal.  Extraocular muscles are intact.  No cervical lymphadenopathy.  His breathing is unlabored.  His heart is regular.  His abdomen is large, but soft.  The wound has improved some and shows signs of healing, which is encouraging.  We will continue with the collagen and plan for the OR, but we will see him back in 2 weeks.     Wayland Denis, DO     CS/MEDQ  D:  07/13/2011  T:  07/14/2011  Job:  [id number]

## 2011-07-25 ENCOUNTER — Encounter (HOSPITAL_COMMUNITY): Payer: Self-pay | Admitting: Pharmacy Technician

## 2011-07-27 NOTE — Progress Notes (Signed)
[facility]  NAME:  Fred Alvarez, Fred Alvarez                    ACCOUNT NO.:  MEDICAL RECORD NO.:  [id number]      DATE OF BIRTH:  [birth year]  PHYSICIAN:  Wayland Denis, DO       VISIT DATE:  07/27/2011                                  OFFICE VISIT   HISTORY:  Fred Alvarez is a 71 year old gentleman here for followup on his abdominal ulcer.  He used collagen over the past week and has had very good improvement in the overall healing process, more so than I have seen with any of the previous dressings so that is encouraging for his overall ability to heal.  There has been no change in his medications.  REVIEW OF SYSTEMS:  Negative.  SOCIAL HISTORY:  Unchanged.  PHYSICIAN EXAM:  GENERAL: He is alert, oriented, cooperative, not in any acute distress.  He is pleasant. HEENT: Pupils equal.  Extraocular muscles are intact. NECK: No cervical lymphadenopathy. LUNGS: His breathing is unlabored. CARDIOVASCULAR: His heart is regular. ABDOMEN: Soft, nontender.  The wound is improving and the overall size has decreased.  His muscles are lateral which is a part of what we planned.  We will continue with the collagen, and surgery is scheduled for the 11th, and we will see him after that.     Wayland Denis, DO     CS/MEDQ  D:  07/27/2011  T:  07/27/2011  Job:  [id number]

## 2011-08-02 ENCOUNTER — Encounter (HOSPITAL_COMMUNITY): Payer: Self-pay

## 2011-08-02 ENCOUNTER — Encounter (HOSPITAL_COMMUNITY)
Admission: RE | Admit: 2011-08-02 | Discharge: 2011-08-02 | Disposition: A | Discharge status: Home / Self Care | Payer: BC Managed Care – PPO | Source: Ambulatory Visit | Attending: General Surgery | Admitting: General Surgery

## 2011-08-02 HISTORY — DX: Transient cerebral ischemic attack, unspecified: G45.9

## 2011-08-02 HISTORY — DX: Adverse effect of unspecified anesthetic, initial encounter: T41.45XA

## 2011-08-02 HISTORY — DX: Other pulmonary embolism without acute cor pulmonale: I26.99

## 2011-08-02 HISTORY — DX: Sleep apnea, unspecified: G47.30

## 2011-08-02 HISTORY — DX: Other complications of anesthesia, initial encounter: T88.59XA

## 2011-08-02 HISTORY — DX: Rheumatoid arthritis, unspecified: M06.9

## 2011-08-02 HISTORY — DX: Acute embolism and thrombosis of unspecified deep veins of unspecified lower extremity: I82.409

## 2011-08-02 LAB — CBC
MCV: 87.1 fL (ref 78.0–100.0)
Platelets: 188 10*3/uL (ref 150–400)
RDW: 19.9 % — ABNORMAL HIGH (ref 11.5–15.5)
WBC: 7.1 10*3/uL (ref 4.0–10.5)

## 2011-08-02 LAB — SURGICAL PCR SCREEN: Staphylococcus aureus: NEGATIVE

## 2011-08-02 LAB — BASIC METABOLIC PANEL
Calcium: 9.3 mg/dL (ref 8.4–10.5)
Creatinine, Ser: 1.37 mg/dL — ABNORMAL HIGH (ref 0.50–1.35)
GFR calc Af Amer: 58 mL/min — ABNORMAL LOW (ref >90)

## 2011-08-02 LAB — PROTIME-INR: INR: 2.13 — ABNORMAL HIGH (ref 0.00–1.49)

## 2011-08-02 LAB — APTT: aPTT: 49 seconds — ABNORMAL HIGH (ref 24–37)

## 2011-08-02 NOTE — Progress Notes (Signed)
Left message with wound care center for Dr. Kelly Splinter to determine if she would have any orders for surgery. Pt reports that Dr. Kelly Splinter would be performing part of surgery. Dr. Kelly Splinter listed on consent as per Dr. Carollee Massed orders.

## 2011-08-02 NOTE — Pre-Procedure Instructions (Signed)
18 Fred Alvarez  08/02/2011   Your procedure is scheduled on:  Monday, march 11  Report to [facility] at 9:30 AM.  Call this number if you have problems the morning of surgery: [phone]   Remember:   Do not eat food:After Midnight.  May have clear liquids: up to 4 Hours before arrival.  Clear liquids include soda, tea, black coffee, apple or grape juice, broth.  Take these medicines the morning of surgery with A SIP OF WATER: Cardizem, Doxycycline, Uloric, Synthroid, Omeprazole,    Take half dose of lantus night before  STOP multiple vitamins and coumadin 08/02/11   Do not wear jewelry, make-up or nail polish.  Do not wear lotions, powders, or perfumes. You may wear deodorant.  Do not shave 48 hours prior to surgery.  Do not bring valuables to the hospital.  Contacts, dentures or bridgework may not be worn into surgery.  Leave suitcase in the car. After surgery it may be brought to your room.  For patients admitted to the hospital, checkout time is 11:00 AM the day of discharge.   Patients discharged the day of surgery will not be allowed to drive home.  Name and phone number of your driver: NA  Special Instructions: CHG Shower Use Special Wash: 1/2 bottle night before surgery and 1/2 bottle morning of surgery.   Please read over the following fact sheets that you were given: Pain Booklet, Coughing and Deep Breathing and Surgical Site Infection Prevention

## 2011-08-04 NOTE — Progress Notes (Signed)
Message left for Dr. Kelly Splinter to send orders for any additional orders/procedure that is to be done during hospitalization on 08/08/2011.//L. Makyia Erxleben,RN

## 2011-08-05 ENCOUNTER — Other Ambulatory Visit: Payer: Self-pay | Admitting: Plastic Surgery

## 2011-08-07 MED ORDER — CEFAZOLIN SODIUM-DEXTROSE 2-3 GM-% IV SOLR
2.0000 g | INTRAVENOUS | Status: AC
  Administered 2011-08-08: 2 g via INTRAVENOUS
  Filled 2011-08-07: qty 50

## 2011-08-08 ENCOUNTER — Encounter (HOSPITAL_COMMUNITY): Payer: Self-pay | Admitting: Anesthesiology

## 2011-08-08 ENCOUNTER — Ambulatory Visit (HOSPITAL_COMMUNITY): Payer: BC Managed Care – PPO | Admitting: Anesthesiology

## 2011-08-08 ENCOUNTER — Inpatient Hospital Stay (HOSPITAL_COMMUNITY)
Admission: RE | Admit: 2011-08-08 | Discharge: 2011-08-19 | DRG: 553 | Disposition: A | Discharge status: Home Health | Payer: BC Managed Care – PPO | Source: Ambulatory Visit | Attending: General Surgery | Admitting: General Surgery

## 2011-08-08 ENCOUNTER — Other Ambulatory Visit: Payer: Self-pay

## 2011-08-08 ENCOUNTER — Encounter (HOSPITAL_COMMUNITY): Payer: Self-pay | Admitting: Surgery

## 2011-08-08 ENCOUNTER — Encounter (HOSPITAL_COMMUNITY)
Admission: RE | Disposition: A | Discharge status: Home Health | Payer: Self-pay | Source: Ambulatory Visit | Attending: General Surgery

## 2011-08-08 ENCOUNTER — Other Ambulatory Visit: Payer: Self-pay | Admitting: Plastic Surgery

## 2011-08-08 DIAGNOSIS — E119 Type 2 diabetes mellitus without complications: Secondary | ICD-10-CM | POA: Diagnosis present

## 2011-08-08 DIAGNOSIS — Z8711 Personal history of peptic ulcer disease: Secondary | ICD-10-CM

## 2011-08-08 DIAGNOSIS — K432 Incisional hernia without obstruction or gangrene: Secondary | ICD-10-CM

## 2011-08-08 DIAGNOSIS — K219 Gastro-esophageal reflux disease without esophagitis: Secondary | ICD-10-CM | POA: Diagnosis present

## 2011-08-08 DIAGNOSIS — N289 Disorder of kidney and ureter, unspecified: Secondary | ICD-10-CM | POA: Diagnosis not present

## 2011-08-08 DIAGNOSIS — E876 Hypokalemia: Secondary | ICD-10-CM | POA: Diagnosis not present

## 2011-08-08 DIAGNOSIS — Y839 Surgical procedure, unspecified as the cause of abnormal reaction of the patient, or of later complication, without mention of misadventure at the time of the procedure: Secondary | ICD-10-CM | POA: Diagnosis present

## 2011-08-08 DIAGNOSIS — K560 Paralytic ileus: Secondary | ICD-10-CM | POA: Diagnosis not present

## 2011-08-08 DIAGNOSIS — Z01812 Encounter for preprocedural laboratory examination: Secondary | ICD-10-CM

## 2011-08-08 DIAGNOSIS — I2782 Chronic pulmonary embolism: Secondary | ICD-10-CM | POA: Diagnosis present

## 2011-08-08 DIAGNOSIS — I4891 Unspecified atrial fibrillation: Secondary | ICD-10-CM | POA: Diagnosis present

## 2011-08-08 DIAGNOSIS — K429 Umbilical hernia without obstruction or gangrene: Secondary | ICD-10-CM | POA: Diagnosis present

## 2011-08-08 DIAGNOSIS — T8189XA Other complications of procedures, not elsewhere classified, initial encounter: Secondary | ICD-10-CM | POA: Diagnosis present

## 2011-08-08 DIAGNOSIS — R339 Retention of urine, unspecified: Secondary | ICD-10-CM | POA: Diagnosis not present

## 2011-08-08 DIAGNOSIS — G4730 Sleep apnea, unspecified: Secondary | ICD-10-CM | POA: Diagnosis present

## 2011-08-08 DIAGNOSIS — M069 Rheumatoid arthritis, unspecified: Secondary | ICD-10-CM | POA: Diagnosis present

## 2011-08-08 DIAGNOSIS — I10 Essential (primary) hypertension: Secondary | ICD-10-CM | POA: Diagnosis present

## 2011-08-08 DIAGNOSIS — N390 Urinary tract infection, site not specified: Secondary | ICD-10-CM | POA: Diagnosis not present

## 2011-08-08 HISTORY — PX: HERNIA REPAIR: SHX51

## 2011-08-08 HISTORY — PX: INCISIONAL HERNIA REPAIR: SHX193

## 2011-08-08 LAB — BASIC METABOLIC PANEL
CO2: 27 mEq/L (ref 19–32)
Calcium: 8.4 mg/dL (ref 8.4–10.5)
Creatinine, Ser: 1.09 mg/dL (ref 0.50–1.35)
Glucose, Bld: 150 mg/dL — ABNORMAL HIGH (ref 70–99)
Sodium: 138 mEq/L (ref 135–145)

## 2011-08-08 LAB — GLUCOSE, CAPILLARY
Glucose-Capillary: 111 mg/dL — ABNORMAL HIGH (ref 70–99)
Glucose-Capillary: 119 mg/dL — ABNORMAL HIGH (ref 70–99)

## 2011-08-08 LAB — CBC
HCT: 36.8 % — ABNORMAL LOW (ref 39.0–52.0)
Hemoglobin: 12.7 g/dL — ABNORMAL LOW (ref 13.0–17.0)
MCH: 28.8 pg (ref 26.0–34.0)
MCV: 88.7 fL (ref 78.0–100.0)
MCV: 90 fL (ref 78.0–100.0)
RBC: 4.15 MIL/uL — ABNORMAL LOW (ref 4.22–5.81)
RBC: 4.41 MIL/uL (ref 4.22–5.81)
RDW: 19.8 % — ABNORMAL HIGH (ref 11.5–15.5)
WBC: 8 10*3/uL (ref 4.0–10.5)

## 2011-08-08 LAB — CREATININE, SERUM: GFR calc Af Amer: 78 mL/min — ABNORMAL LOW (ref >90)

## 2011-08-08 SURGERY — REPAIR, HERNIA, INCISIONAL
Anesthesia: General

## 2011-08-08 MED ORDER — LEVOTHYROXINE SODIUM 25 MCG PO TABS
25.0000 ug | ORAL_TABLET | Freq: Every day | ORAL | Status: DC
  Administered 2011-08-09 – 2011-08-19 (×11): 25 ug via ORAL
  Filled 2011-08-08 (×12): qty 1

## 2011-08-08 MED ORDER — INSULIN ASPART 100 UNIT/ML ~~LOC~~ SOLN
0.0000 [IU] | SUBCUTANEOUS | Status: DC
  Administered 2011-08-08 – 2011-08-09 (×2): 2 [IU] via SUBCUTANEOUS
  Administered 2011-08-09: 8 [IU] via SUBCUTANEOUS
  Administered 2011-08-09: 5 [IU] via SUBCUTANEOUS
  Administered 2011-08-10: 3 [IU] via SUBCUTANEOUS
  Administered 2011-08-10: 5 [IU] via SUBCUTANEOUS
  Administered 2011-08-10: 8 [IU] via SUBCUTANEOUS
  Administered 2011-08-10: 3 [IU] via SUBCUTANEOUS
  Administered 2011-08-11: 5 [IU] via SUBCUTANEOUS
  Administered 2011-08-11: 3 [IU] via SUBCUTANEOUS
  Administered 2011-08-11 (×2): 5 [IU] via SUBCUTANEOUS
  Administered 2011-08-11: 3 [IU] via SUBCUTANEOUS
  Administered 2011-08-11: 8 [IU] via SUBCUTANEOUS
  Administered 2011-08-11: 5 [IU] via SUBCUTANEOUS
  Administered 2011-08-12 (×2): 3 [IU] via SUBCUTANEOUS
  Administered 2011-08-12: 2 [IU] via SUBCUTANEOUS

## 2011-08-08 MED ORDER — HYDROMORPHONE HCL PF 1 MG/ML IJ SOLN
0.2500 mg | INTRAMUSCULAR | Status: DC | PRN
  Administered 2011-08-08 (×4): 0.5 mg via INTRAVENOUS

## 2011-08-08 MED ORDER — HYDROMORPHONE 0.3 MG/ML IV SOLN
INTRAVENOUS | Status: DC
  Administered 2011-08-08: 15:00:00 via INTRAVENOUS
  Administered 2011-08-08: 1.5 mg via INTRAVENOUS
  Administered 2011-08-08: 0.6 mg via INTRAVENOUS
  Administered 2011-08-09: 0.9 mg via INTRAVENOUS
  Administered 2011-08-09: 1.2 mg via INTRAVENOUS
  Administered 2011-08-09: 0.6 mg via INTRAVENOUS
  Administered 2011-08-09: 0.3 mg via INTRAVENOUS
  Administered 2011-08-09: 0.6 mg via INTRAVENOUS
  Administered 2011-08-09: 0.9 mg via INTRAVENOUS
  Administered 2011-08-09: 16:00:00 via INTRAVENOUS
  Administered 2011-08-10: 0.3 mg via INTRAVENOUS
  Administered 2011-08-10: 0.6 mg via INTRAVENOUS
  Administered 2011-08-10: 1.8 mg via INTRAVENOUS
  Administered 2011-08-11: 0.9 mg via INTRAVENOUS
  Administered 2011-08-11: 0.6 mg via INTRAVENOUS
  Administered 2011-08-11: 0.9 mg via INTRAVENOUS
  Administered 2011-08-11: 03:00:00 via INTRAVENOUS

## 2011-08-08 MED ORDER — FENTANYL CITRATE 0.05 MG/ML IJ SOLN
INTRAMUSCULAR | Status: DC | PRN
  Administered 2011-08-08 (×2): 100 ug via INTRAVENOUS
  Administered 2011-08-08 (×2): 25 ug via INTRAVENOUS
  Administered 2011-08-08 (×3): 50 ug via INTRAVENOUS

## 2011-08-08 MED ORDER — EPHEDRINE SULFATE 50 MG/ML IJ SOLN
INTRAMUSCULAR | Status: DC | PRN
  Administered 2011-08-08: 5 mg via INTRAVENOUS

## 2011-08-08 MED ORDER — NALOXONE HCL 0.4 MG/ML IJ SOLN: 0.4000 mg | INTRAMUSCULAR | Status: DC | PRN

## 2011-08-08 MED ORDER — PHENYLEPHRINE HCL 10 MG/ML IJ SOLN
INTRAMUSCULAR | Status: DC | PRN
  Administered 2011-08-08 (×2): 0.1 mg via INTRAVENOUS

## 2011-08-08 MED ORDER — SODIUM CHLORIDE 0.9 % IJ SOLN: 9.0000 mL | INTRAMUSCULAR | Status: DC | PRN

## 2011-08-08 MED ORDER — DEXTROSE 5 % IV SOLN
1000.0000 mg | Freq: Four times a day (QID) | INTRAVENOUS | Status: DC | PRN
  Filled 2011-08-08: qty 10

## 2011-08-08 MED ORDER — SODIUM CHLORIDE 0.9 % IR SOLN
Status: DC | PRN
  Administered 2011-08-08: 13:00:00

## 2011-08-08 MED ORDER — NEOSTIGMINE METHYLSULFATE 1 MG/ML IJ SOLN
INTRAMUSCULAR | Status: DC | PRN
  Administered 2011-08-08: 4 mg via INTRAVENOUS

## 2011-08-08 MED ORDER — METOCLOPRAMIDE HCL 5 MG/ML IJ SOLN
10.0000 mg | Freq: Once | INTRAMUSCULAR | Status: DC | PRN
  Filled 2011-08-08: qty 2

## 2011-08-08 MED ORDER — DILTIAZEM HCL ER COATED BEADS 120 MG PO CP24
120.0000 mg | ORAL_CAPSULE | Freq: Every day | ORAL | Status: DC
  Administered 2011-08-09 – 2011-08-18 (×10): 120 mg via ORAL
  Filled 2011-08-08 (×11): qty 1

## 2011-08-08 MED ORDER — DEXTROSE 5 % IV SOLN
INTRAVENOUS | Status: DC | PRN
  Administered 2011-08-08: 11:00:00 via INTRAVENOUS

## 2011-08-08 MED ORDER — ONDANSETRON HCL 4 MG/2ML IJ SOLN: 4.0000 mg | Freq: Four times a day (QID) | INTRAMUSCULAR | Status: DC | PRN

## 2011-08-08 MED ORDER — FUROSEMIDE 10 MG/ML IJ SOLN
20.0000 mg | Freq: Two times a day (BID) | INTRAMUSCULAR | Status: DC
  Administered 2011-08-08 – 2011-08-09 (×2): 20 mg via INTRAVENOUS
  Filled 2011-08-08 (×6): qty 2

## 2011-08-08 MED ORDER — INSULIN GLARGINE 100 UNIT/ML ~~LOC~~ SOLN
5.0000 [IU] | Freq: Every day | SUBCUTANEOUS | Status: DC
  Administered 2011-08-08 – 2011-08-09 (×2): 5 [IU] via SUBCUTANEOUS

## 2011-08-08 MED ORDER — ROCURONIUM BROMIDE 100 MG/10ML IV SOLN
INTRAVENOUS | Status: DC | PRN
  Administered 2011-08-08 (×2): 10 mg via INTRAVENOUS
  Administered 2011-08-08: 50 mg via INTRAVENOUS

## 2011-08-08 MED ORDER — ONDANSETRON HCL 4 MG PO TABS: 4.0000 mg | ORAL_TABLET | Freq: Four times a day (QID) | ORAL | Status: DC | PRN

## 2011-08-08 MED ORDER — PANTOPRAZOLE SODIUM 40 MG IV SOLR
40.0000 mg | INTRAVENOUS | Status: DC
  Administered 2011-08-08: 40 mg via INTRAVENOUS
  Filled 2011-08-08 (×3): qty 40

## 2011-08-08 MED ORDER — DROPERIDOL 2.5 MG/ML IJ SOLN
INTRAMUSCULAR | Status: DC | PRN
  Administered 2011-08-08 (×2): 0.625 mg via INTRAVENOUS

## 2011-08-08 MED ORDER — 0.9 % SODIUM CHLORIDE (POUR BTL) OPTIME
TOPICAL | Status: DC | PRN
  Administered 2011-08-08: 1000 mL

## 2011-08-08 MED ORDER — ONDANSETRON HCL 4 MG/2ML IJ SOLN
INTRAMUSCULAR | Status: DC | PRN
  Administered 2011-08-08 (×2): 4 mg via INTRAVENOUS

## 2011-08-08 MED ORDER — HETASTARCH-ELECTROLYTES 6 % IV SOLN
INTRAVENOUS | Status: DC | PRN
  Administered 2011-08-08: 12:00:00 via INTRAVENOUS

## 2011-08-08 MED ORDER — METHOCARBAMOL 100 MG/ML IJ SOLN
1000.0000 mg | Freq: Four times a day (QID) | INTRAMUSCULAR | Status: DC | PRN
  Filled 2011-08-08: qty 10

## 2011-08-08 MED ORDER — PROPOFOL 10 MG/ML IV EMUL
INTRAVENOUS | Status: DC | PRN
  Administered 2011-08-08: 200 mg via INTRAVENOUS
  Administered 2011-08-08 (×3): 10 mg via INTRAVENOUS

## 2011-08-08 MED ORDER — MORPHINE SULFATE 2 MG/ML IJ SOLN: 0.0500 mg/kg | INTRAMUSCULAR | Status: DC | PRN

## 2011-08-08 MED ORDER — POTASSIUM CHLORIDE IN NACL 20-0.9 MEQ/L-% IV SOLN
INTRAVENOUS | Status: DC
  Administered 2011-08-08 – 2011-08-10 (×7): via INTRAVENOUS
  Administered 2011-08-11 – 2011-08-14 (×2): 20 mL/h via INTRAVENOUS
  Filled 2011-08-08 (×12): qty 1000

## 2011-08-08 MED ORDER — LACTATED RINGERS IV SOLN
INTRAVENOUS | Status: DC
  Administered 2011-08-08: 11:00:00 via INTRAVENOUS

## 2011-08-08 MED ORDER — HYDROMORPHONE HCL PF 1 MG/ML IJ SOLN
INTRAMUSCULAR | Status: AC
  Filled 2011-08-08: qty 1

## 2011-08-08 MED ORDER — GLYCOPYRROLATE 0.2 MG/ML IJ SOLN
INTRAMUSCULAR | Status: DC | PRN
  Administered 2011-08-08: .5 mg via INTRAVENOUS

## 2011-08-08 MED ORDER — ENOXAPARIN SODIUM 40 MG/0.4ML ~~LOC~~ SOLN
40.0000 mg | SUBCUTANEOUS | Status: DC
  Administered 2011-08-09 – 2011-08-19 (×11): 40 mg via SUBCUTANEOUS
  Filled 2011-08-08 (×11): qty 0.4

## 2011-08-08 MED ORDER — DIPHENHYDRAMINE HCL 50 MG/ML IJ SOLN: 12.5000 mg | Freq: Four times a day (QID) | INTRAMUSCULAR | Status: DC | PRN

## 2011-08-08 MED ORDER — LACTATED RINGERS IV SOLN
INTRAVENOUS | Status: DC | PRN
  Administered 2011-08-08 (×2): via INTRAVENOUS

## 2011-08-08 MED ORDER — DIPHENHYDRAMINE HCL 12.5 MG/5ML PO ELIX
12.5000 mg | ORAL_SOLUTION | Freq: Four times a day (QID) | ORAL | Status: DC | PRN
  Filled 2011-08-08: qty 5

## 2011-08-08 SURGICAL SUPPLY — 57 items
BINDER ABD UNIV 9 30-45 (GAUZE/BANDAGES/DRESSINGS) ×1 IMPLANT
BINDER ABDOMINAL 9 (GAUZE/BANDAGES/DRESSINGS) ×2
BLADE SURG ROTATE 9660 (MISCELLANEOUS) IMPLANT
CANISTER SUCTION 2500CC (MISCELLANEOUS) ×2 IMPLANT
CATH ROBINSON RED A/P 14FR (CATHETERS) ×2 IMPLANT
CHLORAPREP W/TINT 26ML (MISCELLANEOUS) ×2 IMPLANT
CLOTH BEACON ORANGE TIMEOUT ST (SAFETY) ×2 IMPLANT
COVER SURGICAL LIGHT HANDLE (MISCELLANEOUS) ×2 IMPLANT
DERMABOND ADVANCED (GAUZE/BANDAGES/DRESSINGS) ×3
DERMABOND ADVANCED .7 DNX12 (GAUZE/BANDAGES/DRESSINGS) ×3 IMPLANT
DRAIN CHANNEL 19F RND (DRAIN) ×4 IMPLANT
DRAPE LAPAROSCOPIC ABDOMINAL (DRAPES) ×2 IMPLANT
DRAPE UTILITY 15X26 W/TAPE STR (DRAPE) ×4 IMPLANT
ELECT BLADE 4.0 EZ CLEAN MEGAD (MISCELLANEOUS) ×2
ELECT CAUTERY BLADE 6.4 (BLADE) ×2 IMPLANT
ELECT REM PT RETURN 9FT ADLT (ELECTROSURGICAL) ×2
ELECTRODE BLDE 4.0 EZ CLN MEGD (MISCELLANEOUS) ×1 IMPLANT
ELECTRODE REM PT RTRN 9FT ADLT (ELECTROSURGICAL) ×1 IMPLANT
EVACUATOR SILICONE 100CC (DRAIN) ×4 IMPLANT
GLOVE BIO SURGEON STRL SZ 6.5 (GLOVE) ×4 IMPLANT
GLOVE BIO SURGEON STRL SZ7 (GLOVE) ×2 IMPLANT
GLOVE BIO SURGEON STRL SZ8 (GLOVE) ×2 IMPLANT
GLOVE BIOGEL PI IND STRL 7.0 (GLOVE) ×2 IMPLANT
GLOVE BIOGEL PI IND STRL 8 (GLOVE) ×1 IMPLANT
GLOVE BIOGEL PI INDICATOR 7.0 (GLOVE) ×2
GLOVE BIOGEL PI INDICATOR 8 (GLOVE) ×1
GLOVE SURG SS PI 6.5 STRL IVOR (GLOVE) ×2 IMPLANT
GOWN PREVENTION PLUS XLARGE (GOWN DISPOSABLE) ×2 IMPLANT
GOWN STRL NON-REIN LRG LVL3 (GOWN DISPOSABLE) ×4 IMPLANT
KIT BASIN OR (CUSTOM PROCEDURE TRAY) ×2 IMPLANT
KIT ROOM TURNOVER OR (KITS) ×2 IMPLANT
MARKER SKIN DUAL TIP RULER LAB (MISCELLANEOUS) ×2 IMPLANT
MESH PHYSIO OVAL 20X25CM (Mesh General) ×2 IMPLANT
NEEDLE HYPO 25GX1X1/2 BEV (NEEDLE) ×2 IMPLANT
NS IRRIG 1000ML POUR BTL (IV SOLUTION) ×2 IMPLANT
PACK GENERAL/GYN (CUSTOM PROCEDURE TRAY) ×2 IMPLANT
PAD ABD 5X9 TENDERSORB (GAUZE/BANDAGES/DRESSINGS) ×4 IMPLANT
PAD ARMBOARD 7.5X6 YLW CONV (MISCELLANEOUS) ×2 IMPLANT
SPONGE GAUZE 4X4 12PLY (GAUZE/BANDAGES/DRESSINGS) ×2 IMPLANT
SPONGE LAP 18X18 X RAY DECT (DISPOSABLE) ×4 IMPLANT
STAPLER VISISTAT 35W (STAPLE) IMPLANT
SUT ETHIBOND 0 MO6 C/R (SUTURE) ×2 IMPLANT
SUT ETHIBOND NAB CT1 #1 30IN (SUTURE) ×2 IMPLANT
SUT ETHILON 2 0 FS 18 (SUTURE) ×4 IMPLANT
SUT MNCRL AB 4-0 PS2 18 (SUTURE) ×4 IMPLANT
SUT NOVA 1 T20/GS 25DT (SUTURE) ×14 IMPLANT
SUT PROLENE 0 CT 2 (SUTURE) IMPLANT
SUT SILK 2 0 TIES 10X30 (SUTURE) ×2 IMPLANT
SUT VIC AB 3-0 SH 27 (SUTURE) ×4
SUT VIC AB 3-0 SH 27X BRD (SUTURE) ×4 IMPLANT
SUT VIC AB 4-0 PS2 27 (SUTURE) ×2 IMPLANT
SUT VICRYL 4-0 PS2 18IN ABS (SUTURE) ×2 IMPLANT
SYR CONTROL 10ML LL (SYRINGE) ×2 IMPLANT
TOWEL OR 17X24 6PK STRL BLUE (TOWEL DISPOSABLE) ×2 IMPLANT
TOWEL OR 17X26 10 PK STRL BLUE (TOWEL DISPOSABLE) ×2 IMPLANT
TRAY FOLEY CATH 14FRSI W/METER (CATHETERS) IMPLANT
WATER STERILE IRR 1000ML POUR (IV SOLUTION) IMPLANT

## 2011-08-08 NOTE — Anesthesia Preprocedure Evaluation (Signed)
Anesthesia Evaluation  Patient identified by MRN, date of birth, ID band Patient awake    Reviewed: Allergy & Precautions, H&P , NPO status , Patient's Chart, lab work & pertinent test results, reviewed documented beta blocker date and time   History of Anesthesia Complications (+) PONV  Airway Mallampati: II TM Distance: >3 FB Neck ROM: full    Dental   Pulmonary sleep apnea ,          Cardiovascular hypertension, On Medications + dysrhythmias Atrial Fibrillation     Neuro/Psych TIAnegative neurological ROS  negative psych ROS   GI/Hepatic negative GI ROS, Neg liver ROS, PUD, GERD-  Medicated and Controlled,  Endo/Other  Diabetes mellitus-  Renal/GU negative Renal ROS  negative genitourinary   Musculoskeletal   Abdominal   Peds  Hematology negative hematology ROS (+)   Anesthesia Other Findings See surgeon's H&P   Reproductive/Obstetrics negative OB ROS                           Anesthesia Physical Anesthesia Plan  ASA: III  Anesthesia Plan: General   Post-op Pain Management:    Induction: Intravenous  Airway Management Planned: Oral ETT  Additional Equipment:   Intra-op Plan:   Post-operative Plan: Extubation in OR  Informed Consent: I have reviewed the patients History and Physical, chart, labs and discussed the procedure including the risks, benefits and alternatives for the proposed anesthesia with the patient or authorized representative who has indicated his/her understanding and acceptance.     Plan Discussed with: CRNA and Surgeon  Anesthesia Plan Comments:         Anesthesia Quick Evaluation

## 2011-08-08 NOTE — Interval H&P Note (Signed)
History and Physical Interval Note:  08/08/2011 10:45 AM  Fred Alvarez  has presented today for surgery, with the diagnosis of incisional hernia  The various methods of treatment have been discussed with the patient and family. After consideration of risks, benefits and other options for treatment, the patient has consented to  Procedure(s) (LRB): HERNIA REPAIR INCISIONAL (N/A) INSERTION OF MESH (N/A) as a surgical intervention .  The patients' history has been reviewed, patient re-examined, no change in status, stable for surgery.  I have reviewed the patients' chart and labs.  Questions were answered to the patient's satisfaction.     Dachelle Molzahn E

## 2011-08-08 NOTE — H&P (Signed)
  HPI  Patient has a large incisional hernia after duodenal perforation. He's had wound healing complications and is being treated at the wound care center. He saw Dr.Sanger there. She spoke to him about some possibility of a combined procedure to repair his hernia and excise this nonhealing tissue on his abdominal wall. He's eating and moving his bowels well. He was having flareups of fever and rheumatoid arthritis symptoms. He was put on a prednisone taper and this has resolved. During that time, however, his primary care physician ordered a CT scan of his abdomen and pelvis to make sure this didn't show any infection in his abdomen. It did not. It showed large anterior abdominal wall hernia with small bowel and transverse colon no evidence of bowel obstruction or abscess.  Review of Systems   Objective:   Physical Exam  Cardiovascular: Normal rate, regular rhythm and normal heart sounds.  Pulmonary/Chest: Effort normal and breath sounds normal.  Abdomen is soft, there is no tenderness, his hernia is larger. It does reduce however I'm concerned he may have lost some domain. The area of opening wound is about the same size but is clean with granulation tissue   Assessment:    Large incisional hernia with wound healing issues   Plan:    The patient is increasingly symptomatic with this hernia. He is at increased risk if we were to undertake a large repair due to his immunocompromised status. We discussed this in detail. He is very interested in trying to have the hernia fixed. I discussed things with Dr.Sanger and make a plan. I will call him thereafter.    I spoke with Dr. Kelly Splinter and we will plan open incisional hernia repair with mesh and likely component separation. Dr. Kelly Splinter will excise the chronic wound.  I discusses with the patient on the phone.  We went over risks, benefits, and the procedure in detail.  He is agreeable.   Violeta Gelinas, MD, MPH, FACS Pager: [phone]

## 2011-08-08 NOTE — Progress Notes (Signed)
Pt tachy 120's. Rn called on call md. Orders received. Pt was bladder scanned with no residual noted.

## 2011-08-08 NOTE — Anesthesia Postprocedure Evaluation (Signed)
  Anesthesia Post-op Note  Patient: Fred Alvarez  Procedure(s) Performed: Procedure(s) (LRB): HERNIA REPAIR INCISIONAL (N/A) INSERTION OF MESH (N/A)  Patient Location: PACU  Anesthesia Type: General  Level of Consciousness: awake, alert  and oriented  Airway and Oxygen Therapy: Patient Spontanous Breathing and Patient connected to nasal cannula oxygen  Post-op Pain: mild  Post-op Assessment: Post-op Vital signs reviewed, Patient's Cardiovascular Status Stable, Respiratory Function Stable, Patent Airway, No signs of Nausea or vomiting and Pain level controlled  Post-op Vital Signs: Reviewed and stable  Complications: No apparent anesthesia complications

## 2011-08-08 NOTE — Op Note (Signed)
08/08/2011  1:58 PM  PATIENT:  Fred Alvarez  71 y.o. male  PRE-OPERATIVE DIAGNOSIS:  incisional hernia, abdominal wall defect  POST-OPERATIVE DIAGNOSIS:  incisional hernia, abdominal wall defect  PROCEDURE:  Procedure(s): HERNIA REPAIR INCISIONAL WITH COMPONENT SEPARATION AND INSERTION OF MESH  SURGEON:  Surgeon(s): Liz Malady, MD Wayland Denis, DO  PHYSICIAN ASSISTANT:   ASSISTANTS: Claire Sanger, DO   ANESTHESIA:   general  EBL:  Total I/O In: 1600 [I.V.:1100; IV Piggyback:500] Out: 300 [Urine:200; Blood:100]  BLOOD ADMINISTERED:none  DRAINS: (2) Jackson-Pratt drain(s) with closed bulb suction in the sub cut   SPECIMEN:  No Specimen  DISPOSITION OF SPECIMEN:  N/A  COUNTS:  YES  DICTATION: .Dragon Dictation patient has a history of a large incisional hernia status post repair of a duodenal perforation. 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 He is medically stable and the hernia is becoming more symptomatic. He presents today for combined procedure with myself and Dr. Kelly Splinter. She we'll plan to excise this chronic nonhealing tissue and we will repair his hernia with mesh unlikely component separation. He was identified in the preop holding area. He received intravenous antibiotics. Informed consent was obtained. His was brought to the operating room and general endotracheal anesthesia was administered. His abdomen was prepped and draped in sterile fashion. We did a time out procedure. Incision was made along the medial border of his chronic wound along the midline. Subcutaneous tissues were dissected down carefully in the hernia sac was entered. This incision was opened into the sac along its length. We then lysed multiple filmy adhesions involving the colon, small bowel comment omentum. This was all freed up from the right side of the fascial edge. Next we freed up from the left side the fascial edge. We freed things up superiorly and inferiorly as  well. Inferiorly he was noted to have a small umbilical hernia too. This was just below the main incisional hernia. Next some of the redundant sac was excised. Hemostasis was obtained Bovie cautery. Next flaps were raised on either side at just over the abdominal musculature out laterally. Hemostasis was obtained with Bovie cautery. Next we tested the fascia and did not come together in the midline quite. Next the fascia was released out laterally by incising the superficial fascial layer on both sides. This allowed the fascia to come together in the midline without tension. The bowel was rechecked. There have been no enterotomies. It was all viable. Next a 20 x 25 cm physio-mesh was selected. It was trimmed a little bit on the width. Next this was secured to the fascia in an inlay fashion with interrupted #1 Novafil sutures. This was done circumferentially. There were tied down gradually with out trapping any intra-abdominal contents. Next the fascia was closed on top of the mesh with interrupted figure-of-eight #1 Novafil sutures. This came together nicely without tension. Hemostasis was obtained in the subcutaneous tissues. 2 19 French Blake drains were placed exiting inferiorly.  Next Dr. Kelly Splinter excise the chronic wound tissue. The skin was then closed with deep layers approximated with interrupted 3-0 Vicryl suture. Next a layer of running 4-0 Vicryl suture was used to approximate subcutaneous tissues. The skin was finally closed with running 4-0 Monocryl subcuticular stitch followed by Dermabond. Sponge needle and instrument counts were all correct. Patient tolerated procedure well without apparent complication. A binder was placed. He was taken recovery in stable condition.  PATIENT DISPOSITION:  PACU - hemodynamically stable.  Delay start of Pharmacological VTE agent (>24hrs) due to surgical blood loss or risk of bleeding:  no  Violeta Gelinas, MD, MPH, FACS Pager: [phone]  3/11/20131:58  PM

## 2011-08-08 NOTE — Progress Notes (Signed)
Report to Mark RN as caregiver 

## 2011-08-08 NOTE — Progress Notes (Signed)
Patient ID: Fred Alvarez, male   DOB: Jan 23, 1941, 71 y.o.   MRN: [id number] Post-op check in PACU.  Low JP output, adequate pain control. Violeta Gelinas, MD, MPH, FACS Pager: [phone]

## 2011-08-08 NOTE — Transfer of Care (Signed)
Immediate Anesthesia Transfer of Care Note  Patient: Fred Alvarez  Procedure(s) Performed: Procedure(s) (LRB): HERNIA REPAIR INCISIONAL (N/A) INSERTION OF MESH (N/A)  Patient Location: PACU  Anesthesia Type: General  Level of Consciousness: awake, alert  and oriented  Airway & Oxygen Therapy: Patient Spontanous Breathing and Patient connected to face mask oxygen  Post-op Assessment: Report given to PACU RN and Post -op Vital signs reviewed and stable  Post vital signs: Reviewed and stable  Complications: No apparent anesthesia complications

## 2011-08-09 DIAGNOSIS — R339 Retention of urine, unspecified: Secondary | ICD-10-CM

## 2011-08-09 DIAGNOSIS — E119 Type 2 diabetes mellitus without complications: Secondary | ICD-10-CM

## 2011-08-09 DIAGNOSIS — E8779 Other fluid overload: Secondary | ICD-10-CM

## 2011-08-09 LAB — BASIC METABOLIC PANEL
Chloride: 104 mEq/L (ref 96–112)
GFR calc Af Amer: 58 mL/min — ABNORMAL LOW (ref >90)
Potassium: 3.7 mEq/L (ref 3.5–5.1)

## 2011-08-09 LAB — CBC
HCT: 39.9 % (ref 39.0–52.0)
Hemoglobin: 13 g/dL (ref 13.0–17.0)
WBC: 12.5 10*3/uL — ABNORMAL HIGH (ref 4.0–10.5)

## 2011-08-09 LAB — GLUCOSE, CAPILLARY
Glucose-Capillary: 109 mg/dL — ABNORMAL HIGH (ref 70–99)
Glucose-Capillary: 115 mg/dL — ABNORMAL HIGH (ref 70–99)
Glucose-Capillary: 120 mg/dL — ABNORMAL HIGH (ref 70–99)
Glucose-Capillary: 124 mg/dL — ABNORMAL HIGH (ref 70–99)
Glucose-Capillary: 296 mg/dL — ABNORMAL HIGH (ref 70–99)

## 2011-08-09 LAB — URINE MICROSCOPIC-ADD ON

## 2011-08-09 LAB — URINALYSIS, ROUTINE W REFLEX MICROSCOPIC
Protein, ur: 100 mg/dL — AB
Urobilinogen, UA: 1 mg/dL (ref 0.0–1.0)

## 2011-08-09 MED ORDER — HYDROMORPHONE 0.3 MG/ML IV SOLN
INTRAVENOUS | Status: AC
  Filled 2011-08-09: qty 25

## 2011-08-09 MED ORDER — CIPROFLOXACIN HCL 500 MG PO TABS
500.0000 mg | ORAL_TABLET | Freq: Two times a day (BID) | ORAL | Status: AC
  Administered 2011-08-09 – 2011-08-13 (×10): 500 mg via ORAL
  Filled 2011-08-09 (×12): qty 1

## 2011-08-09 MED ORDER — IPRATROPIUM-ALBUTEROL 18-103 MCG/ACT IN AERO
2.0000 | INHALATION_SPRAY | Freq: Four times a day (QID) | RESPIRATORY_TRACT | Status: DC
  Administered 2011-08-09 – 2011-08-11 (×7): 2 via RESPIRATORY_TRACT
  Filled 2011-08-09: qty 14.7

## 2011-08-09 MED ORDER — FUROSEMIDE 10 MG/ML IJ SOLN
40.0000 mg | Freq: Once | INTRAMUSCULAR | Status: AC
  Administered 2011-08-09: 40 mg via INTRAVENOUS
  Filled 2011-08-09: qty 4

## 2011-08-09 NOTE — Progress Notes (Signed)
1 Day Post-Op  Subjective: Feels like he has to urinate but small amounts only coming out  Objective: Vital signs in last 24 hours: Temp:  [96.8 F (36 C)-98.5 F (36.9 C)] 97.7 F (36.5 C) (03/12 0515) Pulse Rate:  [67-129] 116  (03/12 0515) Resp:  [15-30] 30  (03/12 0515) BP: (129-154)/(68-85) 129/68 mmHg (03/12 0515) SpO2:  [79 %-100 %] 92 % (03/12 0515) Last BM Date: 08/08/11  Intake/Output from previous day: 03/11 0701 - 03/12 0700 In: 6273 [P.O.:2025; I.V.:3748; IV Piggyback:500] Out: 1190 [Urine:925; Drains:165; Blood:100] Intake/Output this shift:    General appearance: alert and cooperative Resp: clear to auscultation bilaterally Cardio: reg 110 GI: dressing CDI, drains small amount old blood  Lab Results:   [facility] 08/09/11 0510 08/08/11 1915  WBC 12.5* 9.9  HGB 13.0 12.7*  HCT 39.9 39.7  PLT 211 212   BMET  Basename 08/09/11 0510 08/08/11 1915  NA 139 138  K 3.7 3.2*  CL 104 101  CO2 26 27  GLUCOSE 115* 150*  BUN 14 12  CREATININE 1.37* 1.09  CALCIUM 8.8 8.4   PT/INR  Basename 08/08/11 1010  LABPROT 14.8  INR 1.14   ABG No results found for this basename: PHART:2,PCO2:2,PO2:2,HCO3:2 in the last 72 hours  Studies/Results: No results found.  Anti-infectives: Anti-infectives     Start     Dose/Rate Route Frequency Ordered Stop   08/08/11 1321   polymyxin B 500,000 Units, bacitracin 50,000 Units in sodium chloride irrigation 0.9 % 500 mL irrigation  Status:  Discontinued          As needed 08/08/11 1322 08/08/11 1415   08/07/11 1400   ceFAZolin (ANCEF) IVPB 2 g/50 mL premix        2 g 100 mL/hr over 30 Minutes Intravenous 60 min pre-op 08/07/11 1346 08/08/11 1107          Assessment/Plan: s/p Procedure(s) (LRB): HERNIA REPAIR INCISIONAL (N/A) INSERTION OF MESH (N/A) POD #1 Ileus - NGT has had zero output, will D/C and allow sips ?urinary retention and CRT is up so will place foley and increase IVF Mobilize VTE - Lovenox  LOS:  1 day    Freddi Schrager E 08/09/2011

## 2011-08-09 NOTE — Evaluation (Addendum)
Physical Therapy Evaluation Patient Details Name: Fred Alvarez MRN: [id number] DOB: 1940-10-07 Today's Date: 08/09/2011  Problem List:  Patient Active Problem List  Diagnoses  . SARCOIDOSIS  . DIABETES, TYPE 2  . HYPERLIPIDEMIA  . OBESITY  . HYPERTENSION  . PULMONARY EMBOLISM  . ATRIAL FIBRILLATION  . DEEP VEIN THROMBOSIS/PHLEBITIS  . G E R D  . DUODENAL ULCER  . OSTEOARTHRITIS  . SLEEP APNEA  . OXYGEN-USE OF SUPPLEMENTAL  . Incisional hernia    Past Medical History:  Past Medical History  Diagnosis Date  . Diabetes mellitus   . Arthritis   . Ulcer   . Sarcoidosis   . Hyperlipidemia   . Duodenal ulcer   . Abdominal distention   . Hypertension     Does not have a cardiologist  . TIA (transient ischemic attack) 09/13/10  . Rheumatoid arthritis   . Sleep apnea     uses CPAP   . Deep vein thrombosis   . Pulmonary embolism   . Complication of anesthesia     with previous anesthesia problem did not blow off CO2 correctly was before dx of sleep apnea and CPAP use   Past Surgical History:  Past Surgical History  Procedure Date  . Rotator cuff repair   . Thoracotomy   . Exploratory laparotomy   . Omental patch closure of perforated duodenal ulcer   . Tonsillectomy     at 71 years old    PT Assessment/Plan/Recommendation PT Assessment Clinical Impression Statement: Patient POD1 incisional hernia repair with insertion of mesh presents with acute pain, decreased activity tolerance with limited cardiopulmonary status, and generalized weakness all limiting independence with mobility.  He will benefit from skilled PT in the acute setting to maximize independence and allow d/c home with HHPT and evening assist from wife only. PT Recommendation/Assessment: Patient will need skilled PT in the acute care venue PT Problem List: Decreased strength;Decreased activity tolerance;Decreased mobility;Pain;Cardiopulmonary status limiting activity PT Therapy Diagnosis : Difficulty  walking;Generalized weakness;Acute pain PT Plan PT Frequency: Min 3X/week PT Treatment/Interventions: DME instruction;Gait training;Stair training;Functional mobility training;Therapeutic activities;Therapeutic exercise;Balance training;Patient/family education PT Recommendation Follow Up Recommendations: Home health PT Equipment Recommended: None recommended by PT PT Goals  Acute Rehab PT Goals PT Goal Formulation: With patient/family Time For Goal Achievement: 7 days Pt will Roll Supine to Right Side: with modified independence PT Goal: Rolling Supine to Right Side - Progress: Goal set today Pt will Roll Supine to Left Side: with modified independence PT Goal: Rolling Supine to Left Side - Progress: Goal set today Pt will go Supine/Side to Sit: with min assist PT Goal: Supine/Side to Sit - Progress: Goal set today Pt will go Sit to Supine/Side: with min assist PT Goal: Sit to Supine/Side - Progress: Goal set today Pt will go Sit to Stand: with modified independence PT Goal: Sit to Stand - Progress: Goal set today Pt will go Stand to Sit: with modified independence PT Goal: Stand to Sit - Progress: Goal set today Pt will Ambulate: 51 - 150 feet;with rolling walker;with modified independence PT Goal: Ambulate - Progress: Goal set today Pt will Go Up / Down Stairs: 3-5 stairs;with rail(s);with min assist PT Goal: Up/Down Stairs - Progress: Goal set today Pt will Perform Home Exercise Program: Independently PT Goal: Perform Home Exercise Program - Progress: Goal set today  PT Evaluation Precautions/Restrictions  Precautions Required Braces or Orthoses: Yes Other Brace/Splint: abdominal binder Restrictions Other Position/Activity Restrictions: OOB via log roll only Prior Functioning  Home  Living Lives With: Spouse Type of Home: House Home Layout: One level;Two level;Able to live on main level with bedroom/bathroom;Full bath on main level Alternate Level Stairs-Number of Steps:  will stay on main Home Access: Stairs to enter Entrance Stairs-Rails: Right Entrance Stairs-Number of Steps: 3 Bathroom Shower/Tub: Naval architect Equipment: Bedside commode/3-in-1;Straight cane;Walker - rolling;Shower chair with back Prior Function Level of Independence: Independent with basic ADLs;Independent with gait Cognition Cognition Arousal/Alertness: Awake/alert Overall Cognitive Status: Appears within functional limits for tasks assessed Sensation/Coordination   Extremity Assessment RLE Assessment RLE Assessment: Within Functional Limits (hip flexion painful so only tested antigravity) LLE Assessment LLE Assessment: Within Functional Limits (hip flexion painful so only tested antigravity) Mobility (including Balance) Bed Mobility Bed Mobility: Yes Rolling Left: 3: Mod assist;With rail Rolling Left Details (indicate cue type and reason): assisted with reaching rail and with pad under patient to fully roll onto side Left Sidelying to Sit: 3: Mod assist;With rails Left Sidelying to Sit Details (indicate cue type and reason): cues for technique, right leg off bed as he sat up. Sitting - Scoot to Edge of Bed: 5: Supervision Sitting - Scoot to [place] of Bed Details (indicate cue type and reason): in prep for standing performed unassisted Transfers Transfers: Yes Sit to Stand: 4: Min assist;From elevated surface;With upper extremity assist;From bed Sit to Stand Details (indicate cue type and reason): raised height of bed slightly Stand to Sit: 4: Min assist;With upper extremity assist;With armrests;To chair/3-in-1 Stand to Sit Details: cues for technique Ambulation/Gait Ambulation/Gait: Yes Ambulation/Gait Assistance: 4: Min assist Ambulation/Gait Assistance Details (indicate cue type and reason): 3 pivotal steps to chair (limited due to oxygen tubing and pain) Ambulation Distance (Feet): 2 Feet Assistive device: Rolling walker    Exercise    End of  Session PT - End of Session Equipment Utilized During Treatment: Gait belt;Other (comment) (abdominal binder already in place) Activity Tolerance: Patient limited by pain Patient left: in chair;with call bell in reach;with family/visitor present Nurse Communication: Mobility status for transfers General Behavior During Session: [facility] for tasks performed Cognition: [facility] for tasks performed  [facility] 08/09/2011, 12:22 PM

## 2011-08-09 NOTE — Progress Notes (Signed)
Patient ID: Fred Alvarez, male   DOB: 01-26-1941, 71 y.o.   MRN: [id number] Patient got up to the chair with PT.  Tolerating clears. Violeta Gelinas, MD, MPH, FACS Pager: [phone]

## 2011-08-09 NOTE — Progress Notes (Signed)
Patient ID: BLADE SCHEFF, male   DOB: September 09, 1940, 71 y.o.   MRN: [id number] U/A C/W UTI.  Will start Cipro. Violeta Gelinas, MD, MPH, FACS Pager: [phone]

## 2011-08-09 NOTE — Progress Notes (Signed)
UR complete 

## 2011-08-10 LAB — BASIC METABOLIC PANEL
CO2: 26 mEq/L (ref 19–32)
GFR calc non Af Amer: 43 mL/min — ABNORMAL LOW (ref >90)
Glucose, Bld: 168 mg/dL — ABNORMAL HIGH (ref 70–99)
Potassium: 4.3 mEq/L (ref 3.5–5.1)
Sodium: 138 mEq/L (ref 135–145)

## 2011-08-10 LAB — URINE CULTURE

## 2011-08-10 LAB — GLUCOSE, CAPILLARY
Glucose-Capillary: 159 mg/dL — ABNORMAL HIGH (ref 70–99)
Glucose-Capillary: 244 mg/dL — ABNORMAL HIGH (ref 70–99)

## 2011-08-10 MED ORDER — INSULIN GLARGINE 100 UNIT/ML ~~LOC~~ SOLN
10.0000 [IU] | Freq: Two times a day (BID) | SUBCUTANEOUS | Status: DC
  Administered 2011-08-10 (×2): 10 [IU] via SUBCUTANEOUS

## 2011-08-10 MED ORDER — FUROSEMIDE 40 MG PO TABS
40.0000 mg | ORAL_TABLET | Freq: Three times a day (TID) | ORAL | Status: DC
  Administered 2011-08-10 – 2011-08-18 (×26): 40 mg via ORAL
  Filled 2011-08-10 (×32): qty 1

## 2011-08-10 MED ORDER — PANTOPRAZOLE SODIUM 40 MG PO TBEC
40.0000 mg | DELAYED_RELEASE_TABLET | Freq: Every day | ORAL | Status: DC
  Administered 2011-08-10 – 2011-08-18 (×9): 40 mg via ORAL
  Filled 2011-08-10 (×8): qty 1

## 2011-08-10 MED ORDER — OXYCODONE-ACETAMINOPHEN 5-325 MG PO TABS
1.0000 | ORAL_TABLET | ORAL | Status: DC | PRN
  Administered 2011-08-11: 2 via ORAL
  Filled 2011-08-10: qty 2

## 2011-08-10 NOTE — Progress Notes (Signed)
Patient ID: Fred Alvarez, male   DOB: May 14, 1941, 71 y.o.   MRN: [id number] CRT up to 1.5 today.  Good U/O. Will keep on some IVF and resume home lasix dose.  Re-check in AM. Violeta Gelinas, MD, MPH, FACS Pager: [phone]

## 2011-08-10 NOTE — Progress Notes (Signed)
2 Days Post-Op  Subjective: The patient is post op day 2.  He states that he feels much better than he thought he would.  No complaints this morning.  Objective: Vital signs in last 24 hours: Temp:  [97.3 F (36.3 C)-98.6 F (37 C)] 97.5 F (36.4 C) (03/13 0643) Pulse Rate:  [107-118] 108  (03/13 0643) Resp:  [18-35] 19  (03/13 0643) BP: (122-170)/(71-91) 140/83 mmHg (03/13 0643) SpO2:  [88 %-98 %] 96 % (03/13 0643) FiO2 (%):  [94 %] 94 % (03/13 0412) Weight:  [116 kg (255 lb 11.7 oz)] 116 kg (255 lb 11.7 oz) (03/13 0500) Last BM Date: 08/08/11  Intake/Output from previous day: 03/12 0701 - 03/13 0700 In: -  Out: 1772 [Urine:1750; Drains:22] Intake/Output this shift:    General appearance: alert, cooperative and no distress Incision/Wound:dry and intact.  Minimal drain output.  Lab Results:   [facility] 08/09/11 0510 08/08/11 1915  WBC 12.5* 9.9  HGB 13.0 12.7*  HCT 39.9 39.7  PLT 211 212   BMET  Basename 08/09/11 0510 08/08/11 1915  NA 139 138  K 3.7 3.2*  CL 104 101  CO2 26 27  GLUCOSE 115* 150*  BUN 14 12  CREATININE 1.37* 1.09  CALCIUM 8.8 8.4   PT/INR  Basename 08/08/11 1010  LABPROT 14.8  INR 1.14   ABG No results found for this basename: PHART:2,PCO2:2,PO2:2,HCO3:2 in the last 72 hours  Studies/Results: No results found.  Anti-infectives: Anti-infectives     Start     Dose/Rate Route Frequency Ordered Stop   08/09/11 1330   ciprofloxacin (CIPRO) tablet 500 mg        500 mg Oral 2 times daily 08/09/11 1217 08/16/11 0759   08/08/11 1321   polymyxin B 500,000 Units, bacitracin 50,000 Units in sodium chloride irrigation 0.9 % 500 mL irrigation  Status:  Discontinued          As needed 08/08/11 1322 08/08/11 1415   08/07/11 1400   ceFAZolin (ANCEF) IVPB 2 g/50 mL premix        2 g 100 mL/hr over 30 Minutes Intravenous 60 min pre-op 08/07/11 1346 08/08/11 1107          Assessment/Plan: s/p Procedure(s) (LRB): HERNIA REPAIR INCISIONAL  (N/A) INSERTION OF MESH (N/A) Advance diet  LOS: 2 days    [facility] 08/10/2011

## 2011-08-10 NOTE — Progress Notes (Addendum)
2 Days Post-Op  Subjective: Breathing better, small flatus  Objective: Vital signs in last 24 hours: Temp:  [97.3 F (36.3 C)-98.6 F (37 C)] 97.5 F (36.4 C) (03/13 0643) Pulse Rate:  [107-118] 108  (03/13 0643) Resp:  [18-35] 19  (03/13 0643) BP: (122-170)/(71-91) 140/83 mmHg (03/13 0643) SpO2:  [88 %-98 %] 96 % (03/13 0643) FiO2 (%):  [94 %] 94 % (03/13 0412) Weight:  [116 kg (255 lb 11.7 oz)] 116 kg (255 lb 11.7 oz) (03/13 0500) Last BM Date: 08/08/11  Intake/Output from previous day: 03/12 0701 - 03/13 0700 In: -  Out: 1772 [Urine:1750; Drains:22] Intake/Output this shift:    General appearance: alert, cooperative and no distress Resp: few rales Cardio: regular rate and rhythm GI: incision intact, mild ecchymosis R side of wound, small JP output  Lab Results:   [facility] 08/09/11 0510 08/08/11 1915  WBC 12.5* 9.9  HGB 13.0 12.7*  HCT 39.9 39.7  PLT 211 212   BMET  Basename 08/09/11 0510 08/08/11 1915  NA 139 138  K 3.7 3.2*  CL 104 101  CO2 26 27  GLUCOSE 115* 150*  BUN 14 12  CREATININE 1.37* 1.09  CALCIUM 8.8 8.4   PT/INR  Basename 08/08/11 1010  LABPROT 14.8  INR 1.14   ABG No results found for this basename: PHART:2,PCO2:2,PO2:2,HCO3:2 in the last 72 hours  Studies/Results: No results found.  Anti-infectives: Anti-infectives     Start     Dose/Rate Route Frequency Ordered Stop   08/09/11 1330   ciprofloxacin (CIPRO) tablet 500 mg        500 mg Oral 2 times daily 08/09/11 1217 08/16/11 0759   08/08/11 1321   polymyxin B 500,000 Units, bacitracin 50,000 Units in sodium chloride irrigation 0.9 % 500 mL irrigation  Status:  Discontinued          As needed 08/08/11 1322 08/08/11 1415   08/07/11 1400   ceFAZolin (ANCEF) IVPB 2 g/50 mL premix        2 g 100 mL/hr over 30 Minutes Intravenous 60 min pre-op 08/07/11 1346 08/08/11 1107          Assessment/Plan: s/p Procedure(s) (LRB): HERNIA REPAIR INCISIONAL (N/A) INSERTION OF MESH  (N/A) POD #2 FEN - advance to full liquid diet Pulm - back to usual dose of Lasix, better after lasix and inhalers overnight Urinary retention and UTI - foley and cipro, culture P, may try voiding trial tomorrow Mobilizing with PT DM - SSI and increase Lantus VTE - Lovenox  LOS: 2 days    Fred Alvarez E 08/10/2011

## 2011-08-11 ENCOUNTER — Inpatient Hospital Stay (HOSPITAL_COMMUNITY): Payer: BC Managed Care – PPO

## 2011-08-11 LAB — BASIC METABOLIC PANEL
BUN: 23 mg/dL (ref 6–23)
Chloride: 103 mEq/L (ref 96–112)
Creatinine, Ser: 1.44 mg/dL — ABNORMAL HIGH (ref 0.50–1.35)
GFR calc Af Amer: 55 mL/min — ABNORMAL LOW (ref >90)
GFR calc non Af Amer: 47 mL/min — ABNORMAL LOW (ref >90)
Potassium: 4.3 mEq/L (ref 3.5–5.1)

## 2011-08-11 LAB — GLUCOSE, CAPILLARY
Glucose-Capillary: 221 mg/dL — ABNORMAL HIGH (ref 70–99)
Glucose-Capillary: 239 mg/dL — ABNORMAL HIGH (ref 70–99)

## 2011-08-11 MED ORDER — FUROSEMIDE 10 MG/ML IJ SOLN
20.0000 mg | Freq: Once | INTRAMUSCULAR | Status: AC
  Administered 2011-08-11: 20 mg via INTRAVENOUS
  Filled 2011-08-11: qty 2

## 2011-08-11 MED ORDER — HYDROMORPHONE 0.3 MG/ML IV SOLN
INTRAVENOUS | Status: AC
  Filled 2011-08-11: qty 25

## 2011-08-11 MED ORDER — INSULIN GLARGINE 100 UNIT/ML ~~LOC~~ SOLN
15.0000 [IU] | Freq: Two times a day (BID) | SUBCUTANEOUS | Status: DC
  Administered 2011-08-11 (×2): 15 [IU] via SUBCUTANEOUS

## 2011-08-11 MED ORDER — IPRATROPIUM-ALBUTEROL 18-103 MCG/ACT IN AERO
2.0000 | INHALATION_SPRAY | Freq: Four times a day (QID) | RESPIRATORY_TRACT | Status: DC | PRN
  Administered 2011-08-12: 2 via RESPIRATORY_TRACT

## 2011-08-11 MED ORDER — FUROSEMIDE 10 MG/ML IJ SOLN
40.0000 mg | Freq: Once | INTRAMUSCULAR | Status: AC
  Administered 2011-08-11: 40 mg via INTRAVENOUS
  Filled 2011-08-11: qty 4

## 2011-08-11 MED ORDER — HYDROMORPHONE HCL PF 1 MG/ML IJ SOLN
0.5000 mg | INTRAMUSCULAR | Status: DC | PRN
  Filled 2011-08-11: qty 1

## 2011-08-11 NOTE — Progress Notes (Signed)
Called by primary RN to see pt for increased WOB & decreased Sats.  On arrival pt with very diminished BS & sats 88-90% on 4L.  Pt placed on 50% VM with increase in sats to 98%.  Pt then placed on CPAP with O2 bleed in of 4L with Sats 94%.  Pt states he feels like his breathing is easier.  Will cont. To monitor

## 2011-08-11 NOTE — Progress Notes (Addendum)
Pt complaining of shortness of breath;O2 sats dropped to 86-88% on 4liters O2 nasal cannula. Pt barely able to pull 500 on I.S.;using accessory muscles to breathe. Pt reports he uses CPAP at home.  Rapid response RN,April, in to see patient. Dr. Corliss Skains notified. Order placed in chart.  Pt placed on CPAP by respiratory- O2 sats up to 95%.  Will continue to monitor.

## 2011-08-11 NOTE — Progress Notes (Signed)
Patient c/o of SOB with nasal cannula in on 4L. RN assessed patient using accessory muscles to aid in breathing and RR of 24. CPAP placed on patient with a O2 sat of 92% and breathing visually improving. MD notified and portable chest xray ordered. Results pending. RN will continue to monitor.

## 2011-08-11 NOTE — Progress Notes (Addendum)
Results for RASHAAN, WYLES (MRN [id number]) as of 08/11/2011 11:20  Ref. Range 08/11/2011 00:04 08/11/2011 04:26 08/11/2011 08:07  Glucose-Capillary Latest Range: 70-99 mg/dL 409 (H) 811 (H) 914 (H)   Patient admitted for hernia repair.  CBGs elevated today.  Noted Lantus increased to 15 units bid today.  Home DM medication regimen includes:  Glipizide 10 mg bid Lantus 16 units AM/ 22 unit PM Apidra 3 units with breakfast/ 2 units with lunch/ 4 units with supper  Apidra correction scale- 1 unit for every 50 mg/dl above 782 mg/dl  Noted Heart Healthy diet started today.  If PO intake is 50% or more of meals, may want to start home meal coverage doses as well and slowly titrate Lantus upward to home doses.    Will follow. Ambrose Finland RN, MSN, CDE Diabetes Coordinator Inpatient Diabetes Program [phone]

## 2011-08-11 NOTE — Progress Notes (Signed)
3 Days Post-Op  Subjective: Resp difficulty overnight better on CPAP, wife bringing in patient's own CPAP today, +Flatus  Objective: Vital signs in last 24 hours: Temp:  [97.6 F (36.4 C)-98.7 F (37.1 C)] 97.9 F (36.6 C) (03/14 0646) Pulse Rate:  [103-114] 103  (03/14 0646) Resp:  [17-30] 18  (03/14 0646) BP: (134-157)/(62-87) 134/62 mmHg (03/14 0646) SpO2:  [90 %-94 %] 93 % (03/14 0727) FiO2 (%):  [91 %] 91 % (03/13 1200) Last BM Date: 08/08/11  Intake/Output from previous day: 03/13 0701 - 03/14 0700 In: 1570 [P.O.:720; I.V.:850] Out: 1410 [Urine:1400; Drains:10] Intake/Output this shift:    Awake and alert Lungs: few rales B CV: reg Abd: wound with improved ecchymosis, CDI  Lab Results:   [facility] 08/09/11 0510 08/08/11 1915  WBC 12.5* 9.9  HGB 13.0 12.7*  HCT 39.9 39.7  PLT 211 212   BMET  Basename 08/11/11 0632 08/10/11 0500  NA 135 138  K 4.3 4.3  CL 103 104  CO2 26 26  GLUCOSE 224* 168*  BUN 23 20  CREATININE 1.44* 1.56*  CALCIUM 8.9 9.0   PT/INR  Basename 08/08/11 1010  LABPROT 14.8  INR 1.14   ABG No results found for this basename: PHART:2,PCO2:2,PO2:2,HCO3:2 in the last 72 hours  Studies/Results: Dg Chest Port 1 View  08/11/2011  *RADIOLOGY REPORT*  Clinical Data: Shortness of breath, low O2 saturation  PORTABLE CHEST - 1 VIEW  Comparison: 09/14/2010  Findings: Hypoaeration.  Prominent cardiomediastinal contours. Central vascular congestion.  Cannot exclude lung base opacities or edema.  No pneumothorax.  No acute osseous abnormality.  Surgical clips along the left hilum.  IMPRESSION: Hypoaeration significantly limits the examination.  There is central vascular congestion.  Edema and lung base consolidations cannot be excluded.  No pneumothorax.  Original Report Authenticated By: Waneta Martins, M.D.    Anti-infectives: Anti-infectives     Start     Dose/Rate Route Frequency Ordered Stop   08/09/11 1330   ciprofloxacin (CIPRO) tablet  500 mg        500 mg Oral 2 times daily 08/09/11 1217 08/16/11 0759   08/08/11 1321   polymyxin B 500,000 Units, bacitracin 50,000 Units in sodium chloride irrigation 0.9 % 500 mL irrigation  Status:  Discontinued          As needed 08/08/11 1322 08/08/11 1415   08/07/11 1400   ceFAZolin (ANCEF) IVPB 2 g/50 mL premix        2 g 100 mL/hr over 30 Minutes Intravenous 60 min pre-op 08/07/11 1346 08/08/11 1107          Assessment/Plan: s/p Procedure(s) (LRB): HERNIA REPAIR INCISIONAL (N/A) INSERTION OF MESH (N/A) POD #3 FEN - advance to reg diet Pulm - OK to use home CPAP Urinary retention and UTI - Cipro, D/C foley later today Renal - still 4L +, extra dose lasix today, renal function somewhat better Mobilizing with PT DM - CBG still often over 200, increase Lantus VTE - Lovenox  LOS: 3 days    Renee Beale E 08/11/2011

## 2011-08-11 NOTE — Progress Notes (Signed)
Physical Therapy Treatment Patient Details Name: Fred Alvarez MRN: [id number] DOB: 1941/02/23 Today's Date: 08/11/2011  PT Assessment/Plan  PT - Assessment/Plan Comments on Treatment Session: Pt eager to attempt ambulation this session. Able to progress slowly. Wife present throughout PT Plan: Discharge plan remains appropriate PT Frequency: Min 3X/week Follow Up Recommendations: Home health PT Equipment Recommended: None recommended by PT PT Goals  Acute Rehab PT Goals PT Goal: Rolling Supine to Left Side - Progress: Progressing toward goal PT Goal: Supine/Side to Sit - Progress: Progressing toward goal PT Goal: Sit to Supine/Side - Progress: Progressing toward goal PT Goal: Sit to Stand - Progress: Progressing toward goal PT Goal: Stand to Sit - Progress: Progressing toward goal PT Goal: Ambulate - Progress: Progressing toward goal  PT Treatment Precautions/Restrictions  Precautions Required Braces or Orthoses: Yes Other Brace/Splint: abdominal binder Restrictions Weight Bearing Restrictions: No Other Position/Activity Restrictions: Out of bed via log roll only Mobility (including Balance) Bed Mobility Rolling Left: 3: Mod assist;With rail Rolling Left Details (indicate cue type and reason): A for trunk rotation/LEs Left Sidelying to Sit: 3: Mod assist;HOB elevated (comment degrees) Left Sidelying to Sit Details (indicate cue type and reason): A for LEs off of bed and shoulder positioning into sitting. Cues for positioning.  Sitting - Scoot to Edge of Bed: With rail;4: Min assist Sitting - Scoot to [place] of Bed Details (indicate cue type and reason): Cues for efficient scooting and A with use of chuck pad.  Sit to Supine: 3: Mod assist;With rail;HOB flat Sit to Supine - Details (indicate cue type and reason): A for BLEs. Transfers Sit to Stand: 4: Min assist;From elevated surface;With upper extremity assist;Other (comment);From bed (MinGuard A) Sit to Stand Details  (indicate cue type and reason): Cues for safe hand placement and positioning of LEs Stand to Sit: 4: Min assist;Other (comment);To bed;With upper extremity assist (MinGuard) Stand to Sit Details: Cues for safe hand placement and to control descent onto bed.  Ambulation/Gait Ambulation/Gait Assistance: 4: Min assist Ambulation/Gait Assistance Details (indicate cue type and reason): A with RW placement in tight spaces.  Ambulation Distance (Feet): 30 Feet Assistive device: Rolling walker Gait Pattern: Decreased stride length;Trunk flexed    Exercise    End of Session PT - End of Session Equipment Utilized During Treatment: Other (comment) (abdominal binder in place) Activity Tolerance: Patient limited by pain Patient left: in chair Nurse Communication: Mobility status for transfers General Behavior During Session: [facility] for tasks performed Cognition: [facility] for tasks performed  Fredrich Birks 08/11/2011, 12:43 PM 08/11/2011 Fredrich Birks PTA [phone] pager [phone] office

## 2011-08-12 LAB — BASIC METABOLIC PANEL
BUN: 22 mg/dL (ref 6–23)
Calcium: 8.8 mg/dL (ref 8.4–10.5)
GFR calc Af Amer: 57 mL/min — ABNORMAL LOW (ref >90)
GFR calc non Af Amer: 49 mL/min — ABNORMAL LOW (ref >90)
Glucose, Bld: 140 mg/dL — ABNORMAL HIGH (ref 70–99)
Sodium: 141 mEq/L (ref 135–145)

## 2011-08-12 LAB — PROTIME-INR
INR: 1.27 (ref 0.00–1.49)
Prothrombin Time: 16.2 seconds — ABNORMAL HIGH (ref 11.6–15.2)

## 2011-08-12 MED ORDER — INSULIN ASPART 100 UNIT/ML ~~LOC~~ SOLN: 0.0000 [IU] | Freq: Three times a day (TID) | SUBCUTANEOUS | Status: DC

## 2011-08-12 MED ORDER — POTASSIUM CHLORIDE 10 MEQ/100ML IV SOLN
10.0000 meq | INTRAVENOUS | Status: AC
  Administered 2011-08-12 (×2): 10 meq via INTRAVENOUS
  Filled 2011-08-12: qty 100

## 2011-08-12 MED ORDER — INSULIN GLARGINE 100 UNIT/ML ~~LOC~~ SOLN
22.0000 [IU] | Freq: Every evening | SUBCUTANEOUS | Status: DC
  Administered 2011-08-12 – 2011-08-18 (×7): 22 [IU] via SUBCUTANEOUS

## 2011-08-12 MED ORDER — INSULIN GLARGINE 100 UNIT/ML ~~LOC~~ SOLN
16.0000 [IU] | Freq: Every morning | SUBCUTANEOUS | Status: DC
  Administered 2011-08-12 – 2011-08-18 (×7): 16 [IU] via SUBCUTANEOUS

## 2011-08-12 MED ORDER — POTASSIUM CHLORIDE 20 MEQ/15ML (10%) PO LIQD
40.0000 meq | Freq: Once | ORAL | Status: AC
  Administered 2011-08-12: 40 meq via ORAL
  Filled 2011-08-12 (×2): qty 30

## 2011-08-12 MED ORDER — POTASSIUM CHLORIDE 10 MEQ/100ML IV SOLN
INTRAVENOUS | Status: AC
  Administered 2011-08-12: 10 meq via INTRAVENOUS
  Filled 2011-08-12: qty 100

## 2011-08-12 MED ORDER — WARFARIN - PHARMACIST DOSING INPATIENT: Freq: Every day | Status: DC

## 2011-08-12 MED ORDER — INSULIN ASPART 100 UNIT/ML ~~LOC~~ SOLN
0.0000 [IU] | Freq: Three times a day (TID) | SUBCUTANEOUS | Status: DC
  Administered 2011-08-12 – 2011-08-13 (×2): 2 [IU] via SUBCUTANEOUS
  Administered 2011-08-14: 3 [IU] via SUBCUTANEOUS
  Administered 2011-08-14 – 2011-08-15 (×3): 2 [IU] via SUBCUTANEOUS
  Administered 2011-08-15: 8 [IU] via SUBCUTANEOUS
  Administered 2011-08-15: 3 [IU] via SUBCUTANEOUS
  Administered 2011-08-16: 8 [IU] via SUBCUTANEOUS
  Administered 2011-08-16: 3 [IU] via SUBCUTANEOUS
  Administered 2011-08-16: 5 [IU] via SUBCUTANEOUS
  Administered 2011-08-17: 3 [IU] via SUBCUTANEOUS
  Administered 2011-08-17: 5 [IU] via SUBCUTANEOUS
  Administered 2011-08-17: 2 [IU] via SUBCUTANEOUS
  Administered 2011-08-18 (×2): 3 [IU] via SUBCUTANEOUS
  Administered 2011-08-19: 2 [IU] via SUBCUTANEOUS

## 2011-08-12 MED ORDER — FUROSEMIDE 10 MG/ML IJ SOLN
40.0000 mg | Freq: Once | INTRAMUSCULAR | Status: AC
  Administered 2011-08-12: 40 mg via INTRAVENOUS
  Filled 2011-08-12: qty 4

## 2011-08-12 MED ORDER — WARFARIN SODIUM 3 MG PO TABS
3.0000 mg | ORAL_TABLET | Freq: Once | ORAL | Status: AC
  Administered 2011-08-12: 3 mg via ORAL
  Filled 2011-08-12: qty 1

## 2011-08-12 NOTE — Progress Notes (Addendum)
ANTICOAGULATION CONSULT NOTE - Initial Consult  Pharmacy Consult for Coumadin Indication: History of PE  No Known Allergies  Patient Measurements: Height: 5\' 11"  (180.3 cm) Weight: 255 lb 11.7 oz (116 kg) IBW/kg (Calculated) : 75.3   Vital Signs: Temp: 97.4 F (36.3 C) (03/15 1426) Temp src: Oral (03/15 1426) BP: 134/81 mmHg (03/15 1426) Pulse Rate: 100  (03/15 1426)  Labs:  Basename 08/12/11 0740 08/11/11 7846 08/10/11 0500  HGB -- -- --  HCT -- -- --  PLT -- -- --  APTT -- -- --  LABPROT -- -- --  INR -- -- --  HEPARINUNFRC -- -- --  CREATININE 1.39* 1.44* 1.56*  CKTOTAL -- -- --  CKMB -- -- --  TROPONINI -- -- --   Estimated Creatinine Clearance: 62.2 ml/min (by C-G formula based on Cr of 1.39).  Medical History: Past Medical History  Diagnosis Date  . Diabetes mellitus   . Arthritis   . Ulcer   . Sarcoidosis   . Hyperlipidemia   . Duodenal ulcer   . Abdominal distention   . Hypertension     Does not have a cardiologist  . TIA (transient ischemic attack) 09/13/10  . Rheumatoid arthritis   . Sleep apnea     uses CPAP   . Deep vein thrombosis   . Pulmonary embolism   . Complication of anesthesia     with previous anesthesia problem did not blow off CO2 correctly was before dx of sleep apnea and CPAP use    Medications:  Prescriptions prior to admission  Medication Sig Dispense Refill  . adalimumab (HUMIRA) 40 MG/0.8ML injection Inject 40 mg into the skin every 14 (fourteen) days.      Marland Kitchen diltiazem (CARDIZEM CD) 120 MG 24 hr capsule Take 120 mg by mouth daily.       Marland Kitchen doxycycline (VIBRA-TABS) 100 MG tablet Take 100 mg by mouth 2 (two) times daily.      . febuxostat (ULORIC) 40 MG tablet Take 40 mg by mouth daily.       . folic acid (FOLVITE) 400 MCG tablet Take 800-1,200 mcg by mouth daily. Take 2-3 tablets daily alternating days      . furosemide (LASIX) 40 MG tablet Take 40 mg by mouth 3 (three) times daily.       Marland Kitchen glipiZIDE (GLUCOTROL) 10 MG tablet  Take 10 mg by mouth 2 (two) times daily before a meal.       . insulin glargine (LANTUS) 100 UNIT/ML injection Inject 16-22 Units into the skin 2 (two) times daily. Take 16 units in am and 22 units in pm      . insulin glulisine (APIDRA) 100 UNIT/ML injection Inject 3-16 Units into the skin 3 (three) times daily before meals. 3 units and breakfast, 2 units at lunch; 4 units at supper ; and an additional 2 units  Per 50 over 100 AC SSI      . Insulin Syringe-Needle U-100 (INSULIN SYRINGE 1CC/31GX5/16") 31G X 5/16" 1 ML MISC       . levothyroxine (SYNTHROID, LEVOTHROID) 25 MCG tablet Take 25 mcg by mouth daily.      . methotrexate (RHEUMATREX) 2.5 MG tablet Take 15 mg by mouth once a week. On Tuesday      . Multiple Vitamin (MULITIVITAMIN WITH MINERALS) TABS Take 1 tablet by mouth daily.      Marland Kitchen omeprazole (PRILOSEC) 20 MG capsule Take 20 mg by mouth daily.      . potassium  chloride SA (K-DUR,KLOR-CON) 20 MEQ tablet Take 60 mEq by mouth 3 (three) times daily.       . simvastatin (ZOCOR) 40 MG tablet Take 40 mg by mouth at bedtime.       . triamterene (DYRENIUM) 50 MG capsule Take 50 mg by mouth daily.      . Vitamin D, Ergocalciferol, (DRISDOL) 50000 UNITS CAPS Take 50,000 Units by mouth every 7 (seven) days. Friday      . warfarin (COUMADIN) 3 MG tablet Take 3 mg by mouth daily.       Marland Kitchen guaiFENesin (ROBITUSSIN) 100 MG/5ML SOLN Take 30 mLs by mouth every 6 (six) hours as needed. FOR MUCOUS      Last dose of Warfarin is unknown.   Assessment: 71 year old male admitted 08/08/11 with large incisional hernia  s/p duodenal perforation for repair in OR. Now POD#4 to resume home coumadin for history of PE (unsure of date). Baseline INR 1.14 on 08/08/11.  Patient was also started on Cipro for UTI with plans to continue until 3/19- Will monitor INR closely. Patient is also on Lovenox 40mg  SQ for DVT ppx-This should stop when INR therapeutic.   Goal of Therapy:  INR 2-3   Plan:  1. STAT PT/INR to confirm as  has been >72hrs since last INR. 2. If INR ok, plan for Coumadin 3mg  po x1 tonight.    Link Snuffer, PharmD, BCPS Clinical Pharmacist [phone] 08/12/2011,2:50 PM   Addendum: INR 1.27 on repeat. Will start Coumadin 3mg  po x1 tonight.   Link Snuffer, PharmD, BCPS Clinical Pharmacist [phone] 08/12/2011, 4:09 PM

## 2011-08-12 NOTE — Progress Notes (Signed)
Inpatient Diabetes Program Recommendations  AACE/ADA: New Consensus Statement on Inpatient Glycemic Control (2009)  Target Ranges:  Prepandial:   less than 140 mg/dL      Peak postprandial:   less than 180 mg/dL (1-2 hours)      Critically ill patients:  140 - 180 mg/dL   Reason for Visit: Hyperglycemia  Inpatient Diabetes Program Recommendations Diet: Please change to carbohydrate modified.  Carb mod includes provisions for heart healthy, but heart healthy does not include proviisons for carb mod.  Note: Diet change may help tremendously with glucose control. Thank you, Lenor Coffin, RN, CNS, Diabetes Coordinator [phone])

## 2011-08-12 NOTE — Progress Notes (Signed)
4 Days Post-Op  Subjective: Less SOB, Flatus and feels the need to have BM  Objective: Vital signs in last 24 hours: Temp:  [96.7 F (35.9 C)-98.3 F (36.8 C)] 98.3 F (36.8 C) (03/15 0527) Pulse Rate:  [78-99] 78  (03/15 0527) Resp:  [17-22] 19  (03/15 0527) BP: (113-143)/(61-80) 113/71 mmHg (03/15 0527) SpO2:  [91 %-97 %] 97 % (03/15 0527) Last BM Date: 08/08/11  Intake/Output from previous day: 03/14 0701 - 03/15 0700 In: 1756.3 [P.O.:960; I.V.:796.3] Out: 1858 [Urine:1850; Drains:8] Intake/Output this shift:    Awake and alert Lungs few rales CV Reg Abd evolving ecchymosis abd wall, incision CDI  Lab Results:  No results found for this basename: WBC:2,HGB:2,HCT:2,PLT:2 in the last 72 hours BMET  Basename 08/11/11 0632 08/10/11 0500  NA 135 138  K 4.3 4.3  CL 103 104  CO2 26 26  GLUCOSE 224* 168*  BUN 23 20  CREATININE 1.44* 1.56*  CALCIUM 8.9 9.0   PT/INR No results found for this basename: LABPROT:2,INR:2 in the last 72 hours ABG No results found for this basename: PHART:2,PCO2:2,PO2:2,HCO3:2 in the last 72 hours  Studies/Results: Dg Chest Port 1 View  08/11/2011  *RADIOLOGY REPORT*  Clinical Data: Increased shortness of breath  PORTABLE CHEST - 1 VIEW  Comparison: August 11, 2011 at 3:05  Findings: Mild cardiomegaly and interstitial edema persist but have improved slightly.  No gross effusions are identified.  There is a shallow inspiration.  IMPRESSION: Stable to slightly improved, mild interstitial edema.  Original Report Authenticated By: Brandon Melnick, M.D.   Dg Chest Port 1 View  08/11/2011  *RADIOLOGY REPORT*  Clinical Data: Shortness of breath, low O2 saturation  PORTABLE CHEST - 1 VIEW  Comparison: 09/14/2010  Findings: Hypoaeration.  Prominent cardiomediastinal contours. Central vascular congestion.  Cannot exclude lung base opacities or edema.  No pneumothorax.  No acute osseous abnormality.  Surgical clips along the left hilum.  IMPRESSION:  Hypoaeration significantly limits the examination.  There is central vascular congestion.  Edema and lung base consolidations cannot be excluded.  No pneumothorax.  Original Report Authenticated By: Waneta Martins, M.D.    Anti-infectives: Anti-infectives     Start     Dose/Rate Route Frequency Ordered Stop   08/09/11 1330   ciprofloxacin (CIPRO) tablet 500 mg        500 mg Oral 2 times daily 08/09/11 1217 08/16/11 0759   08/08/11 1321   polymyxin B 500,000 Units, bacitracin 50,000 Units in sodium chloride irrigation 0.9 % 500 mL irrigation  Status:  Discontinued          As needed 08/08/11 1322 08/08/11 1415   08/07/11 1400   ceFAZolin (ANCEF) IVPB 2 g/50 mL premix        2 g 100 mL/hr over 30 Minutes Intravenous 60 min pre-op 08/07/11 1346 08/08/11 1107          Assessment/Plan: s/p Procedure(s) (LRB): HERNIA REPAIR INCISIONAL (N/A) INSERTION OF MESH (N/A) POD #4 FEN - tolerating reg diet Pulm - home CPAP, extra dose lasix today Urinary retention and UTI - Cipro, urine Cx neg so far Renal - lasix as above, BMET P Mobilizing with PT DM - CBG still often over 200, resume home lantus dose VTE - Lovenox, start coumadin  LOS: 4 days    Caren Garske E 08/12/2011

## 2011-08-12 NOTE — Progress Notes (Signed)
Physical Therapy Treatment Patient Details Name: Fred Alvarez MRN: [id number] DOB: 21-May-1941 Today's Date: 08/12/2011  PT Assessment/Plan  PT - Assessment/Plan Comments on Treatment Session: Patient much improved with tolerance to mobility with decreased dyspnea.  Also improved independence with transfer.  Spoke with RN about assisting with ambulation in hallway daily. PT Plan: Discharge plan remains appropriate PT Frequency: Min 3X/week Follow Up Recommendations: Home health PT Equipment Recommended: None recommended by PT PT Goals  Acute Rehab PT Goals Pt will Roll Supine to Left Side: with modified independence PT Goal: Rolling Supine to Left Side - Progress: Progressing toward goal Pt will go Supine/Side to Sit: with modified independence;with HOB not 0 degrees (comment degree) PT Goal: Supine/Side to Sit - Progress: Updated due to goal met Pt will go Sit to Stand: with modified independence PT Goal: Sit to Stand - Progress: Progressing toward goal Pt will go Stand to Sit: with modified independence PT Goal: Stand to Sit - Progress: Progressing toward goal Pt will Ambulate: 51 - 150 feet;with rolling walker;with modified independence PT Goal: Ambulate - Progress: Progressing toward goal  PT Treatment Precautions/Restrictions  Precautions Required Braces or Orthoses: Yes Other Brace/Splint: abdominal binder Restrictions Weight Bearing Restrictions: No Other Position/Activity Restrictions: out of bed via log roll only Mobility (including Balance) Bed Mobility Rolling Left: With rail;5: Supervision Left Sidelying to Sit: 5: Supervision;HOB elevated (comment degrees);With rails Left Sidelying to Sit Details (indicate cue type and reason): HOB 40*; had lowered HOB, then patient needed to use urinal, then patient getting up prior to getting HOB flat Sitting - Scoot to Edge of Bed: 5: Supervision Transfers Sit to Stand: 4: Min assist;From elevated surface;With upper extremity  assist Sit to Stand Details (indicate cue type and reason): minguard assist Stand to Sit: 4: Min assist;With upper extremity assist;To chair/3-in-1 Stand to Sit Details: cues for hand placement and to control descent to chair with 2 pillows stacked. Ambulation/Gait Ambulation/Gait Assistance: 4: Min assist Ambulation/Gait Assistance Details (indicate cue type and reason): minguard assist Ambulation Distance (Feet): 140 Feet Assistive device: Rolling walker Gait Pattern: Step-through pattern;Decreased stride length;Trunk flexed    Exercise    End of Session PT - End of Session Equipment Utilized During Treatment: Gait belt;Other (comment) (portable O2 @ 6 L/min) Activity Tolerance: Patient tolerated treatment well (dyspnea 2/4) Patient left: in chair;with call bell in reach;with family/visitor present General Behavior During Session: [facility] for tasks performed Cognition: [facility] for tasks performed  [facility] 08/12/2011, 1:19 PM

## 2011-08-12 NOTE — Progress Notes (Signed)
08/12/11 PT recommending Home health PT, MD order requested.  In to see patient, discussed recommendation with him and his wife (telephonically).  Pt has used [place] in the past and would like [place] for any homecare needs.  Message left for [employer] with Genevieve Norlander. UR completed  Jim Like RN BSN CCM

## 2011-08-12 NOTE — Progress Notes (Signed)
4 Days Post-Op  Subjective: States he is feeling better and has been using his CPAP.  Objective: Vital signs in last 24 hours: Temp:  [96.7 F (35.9 C)-98.3 F (36.8 C)] 98.3 F (36.8 C) (03/15 0527) Pulse Rate:  [78-99] 78  (03/15 0527) Resp:  [17-22] 19  (03/15 0527) BP: (113-143)/(61-80) 113/71 mmHg (03/15 0527) SpO2:  [91 %-97 %] 97 % (03/15 0527) Last BM Date: 08/08/11  Intake/Output from previous day: 03/14 0701 - 03/15 0700 In: 1756.3 [P.O.:960; I.V.:796.3] Out: 1858 [Urine:1850; Drains:8] Intake/Output this shift:    General appearance: alert, cooperative and no distress Skin intact and seems to be healing.  Lab Results:  No results found for this basename: WBC:2,HGB:2,HCT:2,PLT:2 in the last 72 hours BMET  [facility] 08/11/11 0632 08/10/11 0500  NA 135 138  K 4.3 4.3  CL 103 104  CO2 26 26  GLUCOSE 224* 168*  BUN 23 20  CREATININE 1.44* 1.56*  CALCIUM 8.9 9.0   PT/INR No results found for this basename: LABPROT:2,INR:2 in the last 72 hours ABG No results found for this basename: PHART:2,PCO2:2,PO2:2,HCO3:2 in the last 72 hours  Studies/Results: Dg Chest Port 1 View  08/11/2011  *RADIOLOGY REPORT*  Clinical Data: Increased shortness of breath  PORTABLE CHEST - 1 VIEW  Comparison: August 11, 2011 at 3:05  Findings: Mild cardiomegaly and interstitial edema persist but have improved slightly.  No gross effusions are identified.  There is a shallow inspiration.  IMPRESSION: Stable to slightly improved, mild interstitial edema.  Original Report Authenticated By: Brandon Melnick, M.D.   Dg Chest Port 1 View  08/11/2011  *RADIOLOGY REPORT*  Clinical Data: Shortness of breath, low O2 saturation  PORTABLE CHEST - 1 VIEW  Comparison: 09/14/2010  Findings: Hypoaeration.  Prominent cardiomediastinal contours. Central vascular congestion.  Cannot exclude lung base opacities or edema.  No pneumothorax.  No acute osseous abnormality.  Surgical clips along the left hilum.   IMPRESSION: Hypoaeration significantly limits the examination.  There is central vascular congestion.  Edema and lung base consolidations cannot be excluded.  No pneumothorax.  Original Report Authenticated By: Waneta Martins, M.D.    Anti-infectives: Anti-infectives     Start     Dose/Rate Route Frequency Ordered Stop   08/09/11 1330   ciprofloxacin (CIPRO) tablet 500 mg        500 mg Oral 2 times daily 08/09/11 1217 08/16/11 0759   08/08/11 1321   polymyxin B 500,000 Units, bacitracin 50,000 Units in sodium chloride irrigation 0.9 % 500 mL irrigation  Status:  Discontinued          As needed 08/08/11 1322 08/08/11 1415   08/07/11 1400   ceFAZolin (ANCEF) IVPB 2 g/50 mL premix        2 g 100 mL/hr over 30 Minutes Intravenous 60 min pre-op 08/07/11 1346 08/08/11 1107          Assessment/Plan: s/p Procedure(s) (LRB): HERNIA REPAIR INCISIONAL (N/A) INSERTION OF MESH (N/A) Advance diet and continue to increase activity.  LOS: 4 days    [facility] 08/12/2011

## 2011-08-13 DIAGNOSIS — E876 Hypokalemia: Secondary | ICD-10-CM

## 2011-08-13 DIAGNOSIS — J95821 Acute postprocedural respiratory failure: Secondary | ICD-10-CM

## 2011-08-13 LAB — BASIC METABOLIC PANEL
BUN: 21 mg/dL (ref 6–23)
Chloride: 99 mEq/L (ref 96–112)
GFR calc non Af Amer: 51 mL/min — ABNORMAL LOW (ref >90)
Glucose, Bld: 179 mg/dL — ABNORMAL HIGH (ref 70–99)
Potassium: 2.8 mEq/L — ABNORMAL LOW (ref 3.5–5.1)
Sodium: 142 mEq/L (ref 135–145)

## 2011-08-13 LAB — GLUCOSE, CAPILLARY

## 2011-08-13 LAB — PROTIME-INR
INR: 1.22 (ref 0.00–1.49)
Prothrombin Time: 15.7 seconds — ABNORMAL HIGH (ref 11.6–15.2)

## 2011-08-13 MED ORDER — WARFARIN SODIUM 5 MG PO TABS
5.0000 mg | ORAL_TABLET | Freq: Once | ORAL | Status: AC
  Administered 2011-08-13: 5 mg via ORAL
  Filled 2011-08-13: qty 1

## 2011-08-13 MED ORDER — BOOST / RESOURCE BREEZE PO LIQD
1.0000 | Freq: Three times a day (TID) | ORAL | Status: DC
  Administered 2011-08-14 – 2011-08-19 (×12): 1 via ORAL

## 2011-08-13 MED ORDER — ADALIMUMAB 40 MG/0.8ML ~~LOC~~ KIT
40.0000 mg | PACK | Freq: Once | SUBCUTANEOUS | Status: AC
  Administered 2011-08-13: 40 mg via SUBCUTANEOUS

## 2011-08-13 NOTE — Progress Notes (Signed)
5 Days Post-Op  Subjective: Some coughing during the night, tolerating diet but gets full quickly  Objective: Vital signs in last 24 hours: Temp:  [97.4 F (36.3 C)-98.2 F (36.8 C)] 97.5 F (36.4 C) (03/16 0538) Pulse Rate:  [92-101] 96  (03/16 0538) Resp:  [16-24] 16  (03/16 0538) BP: (124-147)/(62-85) 127/74 mmHg (03/16 0538) SpO2:  [94 %-100 %] 95 % (03/16 0538) Last BM Date: 08/08/11  Intake/Output from previous day: 03/15 0701 - 03/16 0700 In: 1541 [P.O.:720; I.V.:621; IV Piggyback:200] Out: 2563 [Urine:2550; Drains:13] Intake/Output this shift:    General appearance: alert and cooperative Resp: clear to auscultation bilaterally Cardio: regular rate and rhythm GI: Soft, incision CDI, +BS, drains minimal  Lab Results:  No results found for this basename: WBC:2,HGB:2,HCT:2,PLT:2 in the last 72 hours BMET  [facility] 08/12/11 0740 08/11/11 0632  NA 141 135  K 2.8* 4.3  CL 100 103  CO2 34* 26  GLUCOSE 140* 224*  BUN 22 23  CREATININE 1.39* 1.44*  CALCIUM 8.8 8.9   PT/INR  Basename 08/12/11 1528  LABPROT 16.2*  INR 1.27   ABG No results found for this basename: PHART:2,PCO2:2,PO2:2,HCO3:2 in the last 72 hours  Studies/Results: Dg Chest Port 1 View  08/11/2011  *RADIOLOGY REPORT*  Clinical Data: Increased shortness of breath  PORTABLE CHEST - 1 VIEW  Comparison: August 11, 2011 at 3:05  Findings: Mild cardiomegaly and interstitial edema persist but have improved slightly.  No gross effusions are identified.  There is a shallow inspiration.  IMPRESSION: Stable to slightly improved, mild interstitial edema.  Original Report Authenticated By: Brandon Melnick, M.D.    Anti-infectives: Anti-infectives     Start     Dose/Rate Route Frequency Ordered Stop   08/09/11 1330   ciprofloxacin (CIPRO) tablet 500 mg        500 mg Oral 2 times daily 08/09/11 1217 08/16/11 0759   08/08/11 1321   polymyxin B 500,000 Units, bacitracin 50,000 Units in sodium chloride irrigation  0.9 % 500 mL irrigation  Status:  Discontinued          As needed 08/08/11 1322 08/08/11 1415   08/07/11 1400   ceFAZolin (ANCEF) IVPB 2 g/50 mL premix        2 g 100 mL/hr over 30 Minutes Intravenous 60 min pre-op 08/07/11 1346 08/08/11 1107          Assessment/Plan: s/p Procedure(s) (LRB): HERNIA REPAIR INCISIONAL (N/A) INSERTION OF MESH (N/A) POD #5 FEN - tolerating reg diet Pulm - home CPAP, slow progress, no rales today Urinary retention and UTI - Cipro day 5/5 Renal - home lasix dose, check BMET Mobilizing with PT - plan HHPT DM - CBG AC and HS, home lantus dose and carb mod diet VTE - Lovenox + coumadin  LOS: 5 days    Jaylie Neaves E 08/13/2011

## 2011-08-13 NOTE — Progress Notes (Signed)
ANTICOAGULATION CONSULT NOTE - Follow Up Consult  Pharmacy Consult for coumadin Indication: history of PE  No Known Allergies  Patient Measurements: Height: 5\' 11"  (180.3 cm) Weight: 255 lb 11.7 oz (116 kg) IBW/kg (Calculated) : 75.3   Vital Signs: Temp: 97.8 F (36.6 C) (03/16 0955) BP: 124/76 mmHg (03/16 0955) Pulse Rate: 101  (03/16 0955)  Labs:  Basename 08/13/11 1100 08/13/11 0705 08/12/11 1528 08/12/11 0740 08/11/11 0632  HGB -- -- -- -- --  HCT -- -- -- -- --  PLT -- -- -- -- --  APTT -- -- -- -- --  LABPROT -- 15.7* 16.2* -- --  INR -- 1.22 1.27 -- --  HEPARINUNFRC -- -- -- -- --  CREATININE 1.34 -- -- 1.39* 1.44*  CKTOTAL -- -- -- -- --  CKMB -- -- -- -- --  TROPONINI -- -- -- -- --   Estimated Creatinine Clearance: 64.6 ml/min (by C-G formula based on Cr of 1.34).   Medications:  Scheduled:    . adalimumab  40 mg Subcutaneous Once  . ciprofloxacin  500 mg Oral BID  . diltiazem  120 mg Oral Daily  . enoxaparin  40 mg Subcutaneous Q24H  . feeding supplement  1 Container Oral TID WC  . furosemide  40 mg Oral TID  . insulin aspart  0-15 Units Subcutaneous TID WC  . insulin glargine  16 Units Subcutaneous q morning - 10a  . insulin glargine  22 Units Subcutaneous QPM  . levothyroxine  25 mcg Oral QAC breakfast  . pantoprazole  40 mg Oral Q1200  . potassium chloride  10 mEq Intravenous Q1 Hr x 2  . warfarin  3 mg Oral ONCE-1800  . Warfarin - Pharmacist Dosing Inpatient   Does not apply q1800  . DISCONTD: insulin aspart  0-15 Units Subcutaneous Q4H  . DISCONTD: insulin aspart  0-15 Units Subcutaneous TID WC    Assessment: 71 year old male admitted 08/08/11 with large incisional hernia s/p duodenal perforation for repair in OR. Now POD#5 and coumadin resumed 3/15 for history of PE (unsure of date). INR=1.22.  Goal of Therapy:  INR 2-3   Plan:  -Will give 5mg  coumadin today -Follow daily PT/INR  Fred Alvarez 08/13/2011,12:45 PM

## 2011-08-14 LAB — BASIC METABOLIC PANEL
BUN: 20 mg/dL (ref 6–23)
Calcium: 8.5 mg/dL (ref 8.4–10.5)
GFR calc Af Amer: 57 mL/min — ABNORMAL LOW (ref >90)
GFR calc non Af Amer: 50 mL/min — ABNORMAL LOW (ref >90)
Glucose, Bld: 70 mg/dL (ref 70–99)
Potassium: 2.3 mEq/L — CL (ref 3.5–5.1)
Sodium: 142 mEq/L (ref 135–145)

## 2011-08-14 LAB — GLUCOSE, CAPILLARY
Glucose-Capillary: 166 mg/dL — ABNORMAL HIGH (ref 70–99)
Glucose-Capillary: 185 mg/dL — ABNORMAL HIGH (ref 70–99)

## 2011-08-14 LAB — PROTIME-INR: Prothrombin Time: 16.2 seconds — ABNORMAL HIGH (ref 11.6–15.2)

## 2011-08-14 MED ORDER — POTASSIUM CHLORIDE CRYS ER 20 MEQ PO TBCR
40.0000 meq | EXTENDED_RELEASE_TABLET | Freq: Every day | ORAL | Status: DC
  Administered 2011-08-14: 40 meq via ORAL
  Filled 2011-08-14 (×2): qty 2

## 2011-08-14 MED ORDER — POTASSIUM CHLORIDE 10 MEQ/100ML IV SOLN
10.0000 meq | INTRAVENOUS | Status: AC
Start: 2011-08-14 — End: 2011-08-14
  Administered 2011-08-14: 10 meq via INTRAVENOUS
  Filled 2011-08-14 (×3): qty 100

## 2011-08-14 MED ORDER — WARFARIN SODIUM 5 MG PO TABS
5.0000 mg | ORAL_TABLET | Freq: Once | ORAL | Status: AC
  Administered 2011-08-14: 5 mg via ORAL
  Filled 2011-08-14: qty 1

## 2011-08-14 MED ORDER — POTASSIUM CHLORIDE 20 MEQ/15ML (10%) PO LIQD
40.0000 meq | Freq: Every day | ORAL | Status: DC
  Filled 2011-08-14: qty 30

## 2011-08-14 MED ORDER — POTASSIUM CHLORIDE 10 MEQ/100ML IV SOLN
INTRAVENOUS | Status: AC
  Administered 2011-08-14: 10 meq
  Filled 2011-08-14: qty 100

## 2011-08-14 MED ORDER — POLYETHYLENE GLYCOL 3350 17 G PO PACK
17.0000 g | PACK | Freq: Every day | ORAL | Status: DC
  Administered 2011-08-14 – 2011-08-17 (×4): 17 g via ORAL
  Filled 2011-08-14 (×6): qty 1

## 2011-08-14 MED ORDER — POTASSIUM CHLORIDE 10 MEQ/100ML IV SOLN
INTRAVENOUS | Status: AC
  Administered 2011-08-14: 20:00:00
  Administered 2011-08-14: 10 meq
  Filled 2011-08-14: qty 100

## 2011-08-14 NOTE — Progress Notes (Signed)
6 Days Post-Op  Subjective: No BM yet, breathing easier  Objective: Vital signs in last 24 hours: Temp:  [97.8 F (36.6 C)-98.8 F (37.1 C)] 98.2 F (36.8 C) (03/17 0645) Pulse Rate:  [71-101] 71  (03/17 0645) Resp:  [16-18] 18  (03/17 0645) BP: (120-135)/(56-76) 128/69 mmHg (03/17 0645) SpO2:  [96 %-100 %] 100 % (03/17 0645) Last BM Date: 08/08/11  Intake/Output from previous day: 03/16 0701 - 03/17 0700 In: 720 [P.O.:720] Out: 1863 [Urine:1850; Drains:13] Intake/Output this shift:    General appearance: alert and cooperative Resp: clear to auscultation bilaterally Cardio: regular rate and rhythm GI: soft, active BS, incision CDI  Lab Results:  No results found for this basename: WBC:2,HGB:2,HCT:2,PLT:2 in the last 72 hours BMET  [facility] 08/13/11 1100 08/12/11 0740  NA 142 141  K 2.8* 2.8*  CL 99 100  CO2 35* 34*  GLUCOSE 179* 140*  BUN 21 22  CREATININE 1.34 1.39*  CALCIUM 9.0 8.8   PT/INR  Basename 08/13/11 0705 08/12/11 1528  LABPROT 15.7* 16.2*  INR 1.22 1.27   ABG No results found for this basename: PHART:2,PCO2:2,PO2:2,HCO3:2 in the last 72 hours  Studies/Results: No results found.  Anti-infectives: Anti-infectives     Start     Dose/Rate Route Frequency Ordered Stop   08/09/11 1330   ciprofloxacin (CIPRO) tablet 500 mg        500 mg Oral 2 times daily 08/09/11 1217 08/13/11 2058   08/08/11 1321   polymyxin B 500,000 Units, bacitracin 50,000 Units in sodium chloride irrigation 0.9 % 500 mL irrigation  Status:  Discontinued          As needed 08/08/11 1322 08/08/11 1415   08/07/11 1400   ceFAZolin (ANCEF) IVPB 2 g/50 mL premix        2 g 100 mL/hr over 30 Minutes Intravenous 60 min pre-op 08/07/11 1346 08/08/11 1107          Assessment/Plan: s/p Procedure(s) (LRB): HERNIA REPAIR INCISIONAL (N/A) INSERTION OF MESH (N/A) POD #6 FEN - tolerating diet, supplement K Pulm - home CPAP, slow progress, no rales today Urinary retention and  UTI - completed Cipro Renal - home lasix dose, check BMET Mobilizing with PT - plan HHPT DM - CBG AC and HS, home lantus dose and carb mod diet VTE - Lovenox + coumadin  LOS: 6 days    Kyira Volkert E 08/14/2011

## 2011-08-14 NOTE — Progress Notes (Signed)
ANTICOAGULATION CONSULT NOTE - Follow Up Consult  Pharmacy Consult for coumadin Indication: history of PE  No Known Allergies  Patient Measurements: Height: 5\' 11"  (180.3 cm) Weight: 255 lb 11.7 oz (116 kg) IBW/kg (Calculated) : 75.3   Vital Signs: Temp: 97.3 F (36.3 C) (03/17 0956) BP: 108/76 mmHg (03/17 0956) Pulse Rate: 88  (03/17 0956)  Labs:  Basename 08/14/11 0647 08/13/11 1100 08/13/11 0705 08/12/11 1528 08/12/11 0740  HGB -- -- -- -- --  HCT -- -- -- -- --  PLT -- -- -- -- --  APTT -- -- -- -- --  LABPROT 16.2* -- 15.7* 16.2* --  INR 1.27 -- 1.22 1.27 --  HEPARINUNFRC -- -- -- -- --  CREATININE 1.38* 1.34 -- -- 1.39*  CKTOTAL -- -- -- -- --  CKMB -- -- -- -- --  TROPONINI -- -- -- -- --   Estimated Creatinine Clearance: 62.7 ml/min (by C-G formula based on Cr of 1.38).   Medications:  Scheduled:     . ciprofloxacin  500 mg Oral BID  . diltiazem  120 mg Oral Daily  . enoxaparin  40 mg Subcutaneous Q24H  . feeding supplement  1 Container Oral TID WC  . furosemide  40 mg Oral TID  . insulin aspart  0-15 Units Subcutaneous TID WC  . insulin glargine  16 Units Subcutaneous q morning - 10a  . insulin glargine  22 Units Subcutaneous QPM  . levothyroxine  25 mcg Oral QAC breakfast  . pantoprazole  40 mg Oral Q1200  . polyethylene glycol  17 g Oral Daily  . potassium chloride  10 mEq Intravenous Q1 Hr x 5  . potassium chloride      . potassium chloride  40 mEq Oral Daily  . warfarin  5 mg Oral ONCE-1800  . Warfarin - Pharmacist Dosing Inpatient   Does not apply q1800  . DISCONTD: potassium chloride  40 mEq Oral Daily    Assessment: 71 year old male admitted 08/08/11 with large incisional hernia s/p duodenal perforation for repair in OR. Now POD#6 and coumadin resumed 3/15 for history of PE (unsure of date). INR=1.27 (coumadin dose increased 3/16).  Goal of Therapy:  INR 2-3   Plan:  -Continue 5mg  coumadin today -Follow daily PT/INR  Benny Lennert 08/14/2011,12:25 PM

## 2011-08-15 ENCOUNTER — Encounter (HOSPITAL_COMMUNITY): Payer: Self-pay | Admitting: General Surgery

## 2011-08-15 LAB — BASIC METABOLIC PANEL
Chloride: 97 mEq/L (ref 96–112)
GFR calc Af Amer: 57 mL/min — ABNORMAL LOW (ref >90)
Potassium: 2.5 mEq/L — CL (ref 3.5–5.1)

## 2011-08-15 LAB — GLUCOSE, CAPILLARY
Glucose-Capillary: 145 mg/dL — ABNORMAL HIGH (ref 70–99)
Glucose-Capillary: 165 mg/dL — ABNORMAL HIGH (ref 70–99)

## 2011-08-15 LAB — PROTIME-INR
INR: 1.41 (ref 0.00–1.49)
Prothrombin Time: 17.5 seconds — ABNORMAL HIGH (ref 11.6–15.2)

## 2011-08-15 MED ORDER — WARFARIN SODIUM 5 MG PO TABS
5.0000 mg | ORAL_TABLET | Freq: Once | ORAL | Status: AC
  Administered 2011-08-15: 5 mg via ORAL
  Filled 2011-08-15: qty 1

## 2011-08-15 MED ORDER — POTASSIUM CHLORIDE CRYS ER 20 MEQ PO TBCR
60.0000 meq | EXTENDED_RELEASE_TABLET | Freq: Three times a day (TID) | ORAL | Status: DC
  Administered 2011-08-15 – 2011-08-18 (×12): 60 meq via ORAL
  Filled 2011-08-15 (×15): qty 3

## 2011-08-15 NOTE — Progress Notes (Signed)
ANTICOAGULATION CONSULT NOTE - Follow Up Consult  Pharmacy Consult:  Coumadin Indication: history of PE  No Known Allergies  Patient Measurements: Height: 5\' 11"  (180.3 cm) Weight: 255 lb 11.7 oz (116 kg) IBW/kg (Calculated) : 75.3   Vital Signs: Temp: 97.8 F (36.6 C) (03/18 1000) Temp src: Axillary (03/18 1000) BP: 109/63 mmHg (03/18 1000) Pulse Rate: 93  (03/18 1000)  Labs:  Basename 08/15/11 0530 08/14/11 0647 08/13/11 1100 08/13/11 0705  HGB -- -- -- --  HCT -- -- -- --  PLT -- -- -- --  APTT -- -- -- --  LABPROT 17.5* 16.2* -- 15.7*  INR 1.41 1.27 -- 1.22  HEPARINUNFRC -- -- -- --  CREATININE 1.39* 1.38* 1.34 --  CKTOTAL -- -- -- --  CKMB -- -- -- --  TROPONINI -- -- -- --   Estimated Creatinine Clearance: 62.2 ml/min (by C-G formula based on Cr of 1.39).   Medications:  Scheduled:     . diltiazem  120 mg Oral Daily  . enoxaparin  40 mg Subcutaneous Q24H  . feeding supplement  1 Container Oral TID WC  . furosemide  40 mg Oral TID  . insulin aspart  0-15 Units Subcutaneous TID WC  . insulin glargine  16 Units Subcutaneous q morning - 10a  . insulin glargine  22 Units Subcutaneous QPM  . levothyroxine  25 mcg Oral QAC breakfast  . pantoprazole  40 mg Oral Q1200  . polyethylene glycol  17 g Oral Daily  . potassium chloride  10 mEq Intravenous Q1 Hr x 5  . potassium chloride      . potassium chloride  60 mEq Oral TID  . warfarin  5 mg Oral ONCE-1800  . Warfarin - Pharmacist Dosing Inpatient   Does not apply q1800  . DISCONTD: potassium chloride  40 mEq Oral Daily     Assessment: 70 YOM admitted 08/08/11 with large incisional hernia s/p duodenal perforation repair.  Coumadin resumed 3/15 for history of PE.  INR subtherapeutic, no bleeding documented.  Goal of Therapy:  INR 2-3    Plan:  - Continue Coumadin 5mg  PO today - Daily PT/INR - F/U K+, CBGs   Phillips Climes, PharmD 08/15/2011,1:25 PM

## 2011-08-15 NOTE — Progress Notes (Signed)
7 Days Post-Op  Subjective: Flatus but no BM  Objective: Vital signs in last 24 hours: Temp:  [97.3 F (36.3 C)-97.8 F (36.6 C)] 97.3 F (36.3 C) (03/18 0548) Pulse Rate:  [76-94] 76  (03/18 0548) Resp:  [18-19] 19  (03/18 0548) BP: (108-150)/(58-79) 112/58 mmHg (03/18 0548) SpO2:  [94 %-100 %] 94 % (03/18 0548) Last BM Date: 08/08/11  Intake/Output from previous day: 03/17 0701 - 03/18 0700 In: 1080 [P.O.:1080] Out: 1617 [Urine:1600; Drains:16; Stool:1] Intake/Output this shift: Total I/O In: 360 [P.O.:360] Out: 300 [Urine:300]  General appearance: alert and cooperative Resp: clear to auscultation bilaterally Cardio: regular rate and rhythm GI: Soft, +BS, JPs minimal, binder on up in chair  Lab Results:  No results found for this basename: WBC:2,HGB:2,HCT:2,PLT:2 in the last 72 hours BMET  [facility] 08/15/11 0530 08/14/11 0647  NA 141 142  K 2.5* 2.3*  CL 97 97  CO2 35* 37*  GLUCOSE 154* 70  BUN 19 20  CREATININE 1.39* 1.38*  CALCIUM 8.5 8.5   PT/INR  Basename 08/15/11 0530 08/14/11 0647  LABPROT 17.5* 16.2*  INR 1.41 1.27   ABG No results found for this basename: PHART:2,PCO2:2,PO2:2,HCO3:2 in the last 72 hours  Studies/Results: No results found.  Anti-infectives: Anti-infectives     Start     Dose/Rate Route Frequency Ordered Stop   08/09/11 1330   ciprofloxacin (CIPRO) tablet 500 mg        500 mg Oral 2 times daily 08/09/11 1217 08/13/11 2058   08/08/11 1321   polymyxin B 500,000 Units, bacitracin 50,000 Units in sodium chloride irrigation 0.9 % 500 mL irrigation  Status:  Discontinued          As needed 08/08/11 1322 08/08/11 1415   08/07/11 1400   ceFAZolin (ANCEF) IVPB 2 g/50 mL premix        2 g 100 mL/hr over 30 Minutes Intravenous 60 min pre-op 08/07/11 1346 08/08/11 1107          Assessment/Plan: s/p Procedure(s) (LRB): HERNIA REPAIR INCISIONAL (N/A) INSERTION OF MESH (N/A) POD #7 FEN - tolerating diet, get back on home K Pulm  - home CPAP, slow progress, no rales today, I asked Dr. Evlyn Kanner to come see aptient tomorrow as he may need a short period of home O2 Renal - home lasix dose Mobilizing with PT - plan HHPT DM - CBG AC and HS, home lantus dose and carb mod diet VTE - Lovenox + coumadin  LOS: 7 days    Fred Alvarez E 08/15/2011

## 2011-08-15 NOTE — Progress Notes (Signed)
Physical Therapy Treatment Patient Details Name: Fred Alvarez MRN: [id number] DOB: 1941/01/15 Today's Date: 08/15/2011  PT Assessment/Plan  PT - Assessment/Plan Comments on Treatment Session: Pt progressing very well with ambulation and activity tolerance. Able to practice stairs this morning. Will need to review prior to DC home.  PT Plan: Discharge plan remains appropriate PT Frequency: Min 3X/week Follow Up Recommendations: Home health PT Equipment Recommended: None recommended by PT PT Goals  Acute Rehab PT Goals PT Goal: Sit to Stand - Progress: Progressing toward goal PT Goal: Stand to Sit - Progress: Progressing toward goal PT Goal: Ambulate - Progress: Progressing toward goal PT Goal: Up/Down Stairs - Progress: Progressing toward goal  PT Treatment Precautions/Restrictions  Precautions Required Braces or Orthoses: Yes Other Brace/Splint: abdominal binder Restrictions Weight Bearing Restrictions: No Other Position/Activity Restrictions: out of bed via log roll only Mobility (including Balance) Bed Mobility Bed Mobility: No Transfers Sit to Stand: 4: Min assist;With upper extremity assist;From chair/3-in-1 (MinGuard x3) Sit to Stand Details (indicate cue type and reason): Cues for safe hand placement Stand to Sit: 5: Supervision;With upper extremity assist;To chair/3-in-1 Stand to Sit Details: x3. Cues for safest technique and to control descent Ambulation/Gait Ambulation/Gait Assistance: 4: Min assist;Other (comment) (MinGuard A) Ambulation/Gait Assistance Details (indicate cue type and reason): Cues for safety with RW Ambulation Distance (Feet): 225 Feet (one seated rest break) Assistive device: Rolling walker Gait Pattern: Decreased stride length;Step-through pattern;Trunk flexed Stairs: Yes Stairs Assistance: 4: Min assist Stairs Assistance Details (indicate cue type and reason): cues for safest technique Stair Management Technique: One rail Left;Step to  pattern;Sideways Number of Stairs: 4     Exercise  General Exercises - Lower Extremity Ankle Circles/Pumps: AROM;Both;10 reps Long Arc Quad: AROM;Both;10 reps End of Session PT - End of Session Equipment Utilized During Treatment: Gait belt;Other (comment) (portable O2 @ 3L) Activity Tolerance: Patient tolerated treatment well Patient left: in chair;with call bell in reach General Behavior During Session: [facility] for tasks performed Cognition: [facility] for tasks performed  Fredrich Birks 08/15/2011, 12:07 PM 08/15/2011 Fredrich Birks PTA [phone] pager [phone] office

## 2011-08-16 ENCOUNTER — Inpatient Hospital Stay (HOSPITAL_COMMUNITY): Payer: BC Managed Care – PPO

## 2011-08-16 LAB — BASIC METABOLIC PANEL
Chloride: 98 mEq/L (ref 96–112)
Creatinine, Ser: 1.23 mg/dL (ref 0.50–1.35)
GFR calc Af Amer: 66 mL/min — ABNORMAL LOW (ref >90)
GFR calc non Af Amer: 57 mL/min — ABNORMAL LOW (ref >90)
Potassium: 3 mEq/L — ABNORMAL LOW (ref 3.5–5.1)

## 2011-08-16 LAB — PROTIME-INR
INR: 1.81 — ABNORMAL HIGH (ref 0.00–1.49)
Prothrombin Time: 21.3 seconds — ABNORMAL HIGH (ref 11.6–15.2)

## 2011-08-16 LAB — GLUCOSE, CAPILLARY: Glucose-Capillary: 188 mg/dL — ABNORMAL HIGH (ref 70–99)

## 2011-08-16 MED ORDER — WARFARIN SODIUM 4 MG PO TABS
4.0000 mg | ORAL_TABLET | Freq: Once | ORAL | Status: AC
  Administered 2011-08-16: 4 mg via ORAL
  Filled 2011-08-16: qty 1

## 2011-08-16 MED ORDER — POTASSIUM CHLORIDE 10 MEQ/100ML IV SOLN
10.0000 meq | INTRAVENOUS | Status: AC
  Administered 2011-08-16 (×2): 10 meq via INTRAVENOUS
  Filled 2011-08-16: qty 100

## 2011-08-16 MED ORDER — POTASSIUM CHLORIDE 10 MEQ/100ML IV SOLN
INTRAVENOUS | Status: AC
  Administered 2011-08-16: 10 meq via INTRAVENOUS
  Filled 2011-08-16: qty 100

## 2011-08-16 NOTE — Progress Notes (Signed)
8 Days Post-Op  Subjective: Slept well, less SOB  Objective: Vital signs in last 24 hours: Temp:  [97.7 F (36.5 C)-99.2 F (37.3 C)] 97.8 F (36.6 C) (03/19 0649) Pulse Rate:  [73-94] 88  (03/19 0649) Resp:  [16-20] 18  (03/19 0649) BP: (109-148)/(61-75) 123/72 mmHg (03/19 0649) SpO2:  [93 %-100 %] 98 % (03/19 0649) Last BM Date: 08/08/11  Intake/Output from previous day: 03/18 0701 - 03/19 0700 In: 1920 [P.O.:1920] Out: 3065 [Urine:3050; Drains:15] Intake/Output this shift:    General appearance: alert and cooperative Resp: slightly decreased on L Cardio: regular rate and rhythm GI: incision CDI, +BS  Lab Results:  No results found for this basename: WBC:2,HGB:2,HCT:2,PLT:2 in the last 72 hours BMET  Basename 08/16/11 0500 08/15/11 0530  NA 139 141  K 3.0* 2.5*  CL 98 97  CO2 35* 35*  GLUCOSE 171* 154*  BUN 19 19  CREATININE 1.23 1.39*  CALCIUM 8.5 8.5   PT/INR  Basename 08/16/11 0500 08/15/11 0530  LABPROT 21.3* 17.5*  INR 1.81* 1.41   ABG No results found for this basename: PHART:2,PCO2:2,PO2:2,HCO3:2 in the last 72 hours  Studies/Results: No results found.  Anti-infectives: Anti-infectives     Start     Dose/Rate Route Frequency Ordered Stop   08/09/11 1330   ciprofloxacin (CIPRO) tablet 500 mg        500 mg Oral 2 times daily 08/09/11 1217 08/13/11 2058   08/08/11 1321   polymyxin B 500,000 Units, bacitracin 50,000 Units in sodium chloride irrigation 0.9 % 500 mL irrigation  Status:  Discontinued          As needed 08/08/11 1322 08/08/11 1415   08/07/11 1400   ceFAZolin (ANCEF) IVPB 2 g/50 mL premix        2 g 100 mL/hr over 30 Minutes Intravenous 60 min pre-op 08/07/11 1346 08/08/11 1107          Assessment/Plan: s/p Procedure(s) (LRB): HERNIA REPAIR INCISIONAL (N/A) INSERTION OF MESH (N/A) POD #8 FEN - tolerating diet, Home K dose plus supplement Pulm - home CPAP, slow progress, appreciate Dr. Evlyn Kanner seeing patinet - CXR P Renal -  improved Mobilizing with PT - plan HHPT DM - CBG AC and HS, home lantus dose and carb mod diet VTE - Lovenox + coumadin  8 Days Post-Op

## 2011-08-16 NOTE — Progress Notes (Signed)
Results for EMETERIO, BALKE (MRN [id number]) as of 08/16/2011 10:11  Ref. Range 08/15/2011 07:57 08/15/2011 12:03 08/15/2011 16:47 08/15/2011 21:59  Glucose-Capillary Latest Range: 70-99 mg/dL 409 (H) 811 (H) 914 (H) 192 (H)   Noted postprandial CBGs elevated.  Patient takes meal coverage at home per home med rec. PO Intake 100% of meals.  Please consider restarting patent's home doses of meal coverage.  Novolog 3 units with breakfast        2 units with lunch        4 units with supper  Will follow. Ambrose Finland RN, MSN, CDE Diabetes Coordinator Inpatient Diabetes Program [phone]

## 2011-08-16 NOTE — Progress Notes (Signed)
Subjective: Feeling better this AM. Sitting up eating breakfast. Some edema. Some mild clear phlegm. No overnight breathing issues. No CP. Stronger  Objective: Vital signs in last 24 hours: Temp:  [97.7 F (36.5 C)-99.2 F (37.3 C)] 97.8 F (36.6 C) (03/19 0649) Pulse Rate:  [73-94] 88  (03/19 0649) Resp:  [16-20] 18  (03/19 0649) BP: (109-148)/(61-75) 123/72 mmHg (03/19 0649) SpO2:  [93 %-100 %] 98 % (03/19 0649)  Intake/Output from previous day: 03/18 0701 - 03/19 0700 In: 1920 [P.O.:1920] Out: 3065 [Urine:3050; Drains:15] Intake/Output this shift:    General: alert, O2 in place, face symmetric. Neck supple. Lungs diminshed at left base, clearer on right. Ht Regular with murmur. 2+ edema. Alert clear speech, mentating well  Lab Results  No results found for this basename: WBC:2,RBC:2,HGB:2,HCT:2,MCV:2,MCH:2,RDW:2,PLT:2 in the last 72 hours  Basename 08/16/11 0500 08/15/11 0530  NA 139 141  K 3.0* 2.5*  CL 98 97  CO2 35* 35*  GLUCOSE 171* 154*  BUN 19 19  CREATININE 1.23 1.39*  CALCIUM 8.5 8.5    Studies/Results: No results found.  Scheduled Meds:   . diltiazem  120 mg Oral Daily  . enoxaparin  40 mg Subcutaneous Q24H  . feeding supplement  1 Container Oral TID WC  . furosemide  40 mg Oral TID  . insulin aspart  0-15 Units Subcutaneous TID WC  . insulin glargine  16 Units Subcutaneous q morning - 10a  . insulin glargine  22 Units Subcutaneous QPM  . levothyroxine  25 mcg Oral QAC breakfast  . pantoprazole  40 mg Oral Q1200  . polyethylene glycol  17 g Oral Daily  . potassium chloride  60 mEq Oral TID  . warfarin  5 mg Oral ONCE-1800  . Warfarin - Pharmacist Dosing Inpatient   Does not apply q1800  . DISCONTD: potassium chloride  40 mEq Oral Daily   Continuous Infusions:   . DISCONTD: 0.9 % NaCl with KCl 20 mEq / L 20 mL/hr (08/14/11 0122)   PRN Meds:albuterol-ipratropium, diphenhydrAMINE, diphenhydrAMINE, HYDROmorphone (DILAUDID) injection,  methocarbamol(ROBAXIN) IV, naloxone, ondansetron (ZOFRAN) IV, ondansetron (ZOFRAN) IV, ondansetron, oxyCODONE-acetaminophen  Assessment/Plan: DYSPNEA/HYPOXIA: Has many coexistant problems including probable pleural effusions, sleep apnea, some CHF, chronic pulmonary embolism and some splinting after abdominal procedure. Actually doing a bit better today. Last CXR showed some interstitial edema. Fluids now turned off and getting good regimen of diuretics. Will check CXR again. Will check BNP. HYPOKALEMIA: Getting Rx DM TYPE 2: BS's reasonable RHEUMATOID ARTHRITIS: Doing OK HYPERTENSION: BP good. CHRONIC PE: Still subtherapeutic on INR RENAL INSUFFICIENCY: Doing well  LOS: 8 days   Masayo Fera ALAN 08/16/2011, 8:35 AM

## 2011-08-16 NOTE — Progress Notes (Signed)
Utilization review completed. Fred Alvarez Diane3/19/2013  

## 2011-08-16 NOTE — Progress Notes (Addendum)
ANTICOAGULATION CONSULT NOTE - Follow Up Consult  Pharmacy Consult:  Coumadin Indication: history of PE  No Known Allergies  Patient Measurements: Height: 5\' 11"  (180.3 cm) Weight: 255 lb 11.7 oz (116 kg) IBW/kg (Calculated) : 75.3   Vital Signs: Temp: 98.1 F (36.7 C) (03/19 1050) Temp src: Oral (03/19 1050) BP: 120/65 mmHg (03/19 1050) Pulse Rate: 95  (03/19 1050)  Labs:  Basename 08/16/11 0500 08/15/11 0530 08/14/11 0647  HGB -- -- --  HCT -- -- --  PLT -- -- --  APTT -- -- --  LABPROT 21.3* 17.5* 16.2*  INR 1.81* 1.41 1.27  HEPARINUNFRC -- -- --  CREATININE 1.23 1.39* 1.38*  CKTOTAL -- -- --  CKMB -- -- --  TROPONINI -- -- --   Estimated Creatinine Clearance: 70.3 ml/min (by C-G formula based on Cr of 1.23).   Medications:  Scheduled:     . diltiazem  120 mg Oral Daily  . enoxaparin  40 mg Subcutaneous Q24H  . feeding supplement  1 Container Oral TID WC  . furosemide  40 mg Oral TID  . insulin aspart  0-15 Units Subcutaneous TID WC  . insulin glargine  16 Units Subcutaneous q morning - 10a  . insulin glargine  22 Units Subcutaneous QPM  . levothyroxine  25 mcg Oral QAC breakfast  . pantoprazole  40 mg Oral Q1200  . polyethylene glycol  17 g Oral Daily  . potassium chloride  10 mEq Intravenous Q1 Hr x 2  . potassium chloride  60 mEq Oral TID  . warfarin  5 mg Oral ONCE-1800  . Warfarin - Pharmacist Dosing Inpatient   Does not apply q1800     Assessment: [name] admitted 08/08/11 with large incisional hernia s/p duodenal perforation repair.  Coumadin resumed 3/15 for history of PE.  INR trending up towards goal, no bleeding reported.  Goal of Therapy:  INR 2-3    Plan:  - Coumadin 4mg  PO today - Continue Lovenox until INR >/= 2 - Daily PT/INR - F/U K+ - Consider resuming home med glipizide for better glycemic control   Phillips Climes, PharmD 08/16/2011,12:34 PM

## 2011-08-16 NOTE — Progress Notes (Signed)
Physical Therapy Treatment Patient Details Name: Fred Alvarez MRN: [id number] DOB: 09/05/1940 Today's Date: 08/16/2011  PT Assessment/Plan  PT - Assessment/Plan Comments on Treatment Session: Pt continues to show great improvement with mobility. Patient requesting to practice steps again prior to [place] home. Patient stated that his wife was able to purchase temporary rails for his bed at home.  PT Plan: Discharge plan remains appropriate PT Frequency: Min 3X/week Follow Up Recommendations: Home health PT Equipment Recommended: None recommended by PT PT Goals  Acute Rehab PT Goals PT Goal: Rolling Supine to Right Side - Progress: Met PT Goal: Rolling Supine to Left Side - Progress: Met PT Goal: Supine/Side to Sit - Progress: Met PT Goal: Sit to Supine/Side - Progress: Met PT Goal: Sit to Stand - Progress: Met PT Goal: Stand to Sit - Progress: Met PT Goal: Ambulate - Progress: Progressing toward goal PT Goal: Up/Down Stairs - Progress: Progressing toward goal  PT Treatment Precautions/Restrictions  Precautions Required Braces or Orthoses: Yes Other Brace/Splint: abdominal binder Restrictions Weight Bearing Restrictions: No Other Position/Activity Restrictions: out of bed via log roll only Mobility (including Balance) Bed Mobility Rolling Left: 6: Modified independent (Device/Increase time);With rail Left Sidelying to Sit: 6: Modified independent (Device/Increase time);With rails Sitting - Scoot to Edge of Bed: 7: Independent Sit to Supine: With rail;6: Modified independent (Device/Increase time) Transfers Sit to Stand: 6: Modified independent (Device/Increase time);From chair/3-in-1;From bed;With upper extremity assist;With armrests Stand to Sit: 6: Modified independent (Device/Increase time);To bed;To chair/3-in-1;With armrests;With upper extremity assist Ambulation/Gait Ambulation/Gait Assistance: 5: Supervision Ambulation/Gait Assistance Details (indicate cue type and  reason): Cues to keep LEs within RW Ambulation Distance (Feet): 300 Feet Assistive device: Rolling walker Gait Pattern: Step-through pattern;Decreased stride length Stairs: Yes Stairs Assistance: 4: Min assist Stairs Assistance Details (indicate cue type and reason): A for safety and balance.  Stair Management Technique: One rail Left;Step to pattern;Sideways;Forwards Number of Stairs: 4     Exercise    End of Session PT - End of Session Equipment Utilized During Treatment: Gait belt;Other (comment) (portable O2 @ 3L. ) Activity Tolerance: Patient tolerated treatment well Patient left: in chair General Behavior During Session: [facility] for tasks performed Cognition: [facility] for tasks performed  Fredrich Birks 08/16/2011, 11:25 AM 08/16/2011 Fredrich Birks PTA [phone] pager [phone] office

## 2011-08-17 ENCOUNTER — Encounter (HOSPITAL_BASED_OUTPATIENT_CLINIC_OR_DEPARTMENT_OTHER): Payer: BC Managed Care – PPO

## 2011-08-17 LAB — BASIC METABOLIC PANEL
BUN: 20 mg/dL (ref 6–23)
Calcium: 8.9 mg/dL (ref 8.4–10.5)
Creatinine, Ser: 1.32 mg/dL (ref 0.50–1.35)
GFR calc non Af Amer: 52 mL/min — ABNORMAL LOW (ref >90)
Glucose, Bld: 147 mg/dL — ABNORMAL HIGH (ref 70–99)

## 2011-08-17 LAB — PROTIME-INR
INR: 1.69 — ABNORMAL HIGH (ref 0.00–1.49)
Prothrombin Time: 20.2 seconds — ABNORMAL HIGH (ref 11.6–15.2)

## 2011-08-17 LAB — GLUCOSE, CAPILLARY
Glucose-Capillary: 129 mg/dL — ABNORMAL HIGH (ref 70–99)
Glucose-Capillary: 183 mg/dL — ABNORMAL HIGH (ref 70–99)

## 2011-08-17 MED ORDER — WARFARIN SODIUM 7.5 MG PO TABS
7.5000 mg | ORAL_TABLET | Freq: Once | ORAL | Status: AC
  Administered 2011-08-17: 7.5 mg via ORAL
  Filled 2011-08-17: qty 1

## 2011-08-17 MED ORDER — METHOTREXATE 2.5 MG PO TABS
15.0000 mg | ORAL_TABLET | ORAL | Status: DC
  Administered 2011-08-17: 15 mg via ORAL
  Filled 2011-08-17 (×3): qty 6

## 2011-08-17 NOTE — Progress Notes (Signed)
Physical Therapy Treatment Patient Details Name: Fred Alvarez MRN: [id number] DOB: 1941/04/23 Today's Date: 08/17/2011  PT Assessment/Plan  PT - Assessment/Plan Comments on Treatment Session: Pt has progressed very well with therapy. All goals met. Will sign off. Encourage daily ambulation with staff/family. Pt ambulated with no O2 and stats at 93% RA PT Plan: All goals met and education completed, patient dischaged from PT services Follow Up Recommendations: Home health PT Equipment Recommended: None recommended by PT PT Goals  Acute Rehab PT Goals PT Goal: Rolling Supine to Right Side - Progress: Met PT Goal: Rolling Supine to Left Side - Progress: Met PT Goal: Supine/Side to Sit - Progress: Met PT Goal: Sit to Supine/Side - Progress: Met PT Goal: Sit to Stand - Progress: Met PT Goal: Stand to Sit - Progress: Met PT Goal: Ambulate - Progress: Met PT Goal: Up/Down Stairs - Progress: Met PT Goal: Perform Home Exercise Program - Progress: Other (comment) (Pt able to verbalize and states doing independently)  PT Treatment Precautions/Restrictions  Precautions Required Braces or Orthoses: Yes Other Brace/Splint: abdominal binder Restrictions Weight Bearing Restrictions: No Other Position/Activity Restrictions: out of bed via log roll only Mobility (including Balance) Bed Mobility Rolling Left: 6: Modified independent (Device/Increase time);With rail Left Sidelying to Sit: 6: Modified independent (Device/Increase time);With rails Sitting - Scoot to Edge of Bed: 7: Independent Transfers Sit to Stand: 6: Modified independent (Device/Increase time) Stand to Sit: 6: Modified independent (Device/Increase time) Ambulation/Gait Ambulation/Gait Assistance: 6: Modified independent (Device/Increase time) Ambulation Distance (Feet): 400 Feet Assistive device: Rolling walker Gait Pattern: Within Functional Limits Stairs Assistance: Other (comment) (MinGuardA) Stairs Assistance Details  (indicate cue type and reason): MinGuard for safety Stair Management Technique: Alternating pattern;Sideways;One rail Left Number of Stairs: 6     Exercise    End of Session PT - End of Session Equipment Utilized During Treatment: Gait belt Activity Tolerance: Patient tolerated treatment well Patient left: in chair Nurse Communication: Mobility status for transfers General Behavior During Session: [facility] for tasks performed Cognition: [facility] for tasks performed  Fredrich Birks 08/17/2011, 8:37 AM 08/17/2011 Fredrich Birks PTA [phone] pager [phone] office

## 2011-08-17 NOTE — Plan of Care (Cosign Needed)
Problem: Discharge Progression Outcomes Goal: Activity appropriate for discharge plan Outcome: Completed/Met Date Met:  08/17/11 Patient able to ambulate without assistance and completed stair training. DC'd from acute PT services.

## 2011-08-17 NOTE — Progress Notes (Signed)
Subjective: Feels well. Did well with therapy without oxygen. Sat stayed at 93%. No cough  Objective: Vital signs in last 24 hours: Temp:  [98 F (36.7 C)-98.2 F (36.8 C)] 98 F (36.7 C) (03/20 0551) Pulse Rate:  [81-97] 81  (03/20 0551) Resp:  [16-21] 18  (03/20 0551) BP: (112-122)/(57-78) 112/57 mmHg (03/20 0551) SpO2:  [95 %-99 %] 98 % (03/20 0551)  Intake/Output from previous day: 03/19 0701 - 03/20 0700 In: 360 [P.O.:360] Out: 2005 [Urine:1975; Drains:30] Intake/Output this shift:    General: alert  Lab Results  No results found for this basename: WBC:2,RBC:2,HGB:2,HCT:2,MCV:2,MCH:2,RDW:2,PLT:2 in the last 72 hours  Basename 08/17/11 0617 08/16/11 0500  NA 137 139  K 3.7 3.0*  CL 97 98  CO2 31 35*  GLUCOSE 147* 171*  BUN 20 19  CREATININE 1.32 1.23  CALCIUM 8.9 8.5    Studies/Results: Dg Chest 2 View  08/16/2011  *RADIOLOGY REPORT*  Clinical Data: History of interstitial edema.  Follow-up.  CHEST - 2 VIEW  Comparison: 09/14/2010.  08/11/2011.  Findings: There is stable mild cardiac silhouette enlargement. There is ectasia of thoracic aorta.  Surgical staples on the left appear unchanged.  There is an area of subsegmental atelectasis which has developed in the lower right lung.  There are low lung volumes.  No definite pulmonary edema or pleural effusion is seen. There is minimal degenerative spondylosis.  IMPRESSION: Stable cardiac silhouette enlargement.  Area of subsegmental atelectasis has developed in the lower right lung.  Hypoinflation of the lungs.  No definite pulmonary edema or pleural effusion is seen.  Original Report Authenticated By: Crawford Givens, M.D.    Scheduled Meds:   . diltiazem  120 mg Oral Daily  . enoxaparin  40 mg Subcutaneous Q24H  . feeding supplement  1 Container Oral TID WC  . furosemide  40 mg Oral TID  . insulin aspart  0-15 Units Subcutaneous TID WC  . insulin glargine  16 Units Subcutaneous q morning - 10a  . insulin glargine  22  Units Subcutaneous QPM  . levothyroxine  25 mcg Oral QAC breakfast  . pantoprazole  40 mg Oral Q1200  . polyethylene glycol  17 g Oral Daily  . potassium chloride  10 mEq Intravenous Q1 Hr x 2  . potassium chloride  60 mEq Oral TID  . warfarin  4 mg Oral ONCE-1800  . Warfarin - Pharmacist Dosing Inpatient   Does not apply q1800   Continuous Infusions:  PRN Meds:albuterol-ipratropium, diphenhydrAMINE, diphenhydrAMINE, HYDROmorphone (DILAUDID) injection, methocarbamol(ROBAXIN) IV, naloxone, ondansetron (ZOFRAN) IV, ondansetron (ZOFRAN) IV, ondansetron, oxyCODONE-acetaminophen  Assessment/Plan: DYSPNEA: CXR improved. Sats holding without supplemental insulin. BNP only 216. Doing well. Call if we can help further   LOS: 9 days   Naitik Hermann ALAN 08/17/2011, 8:34 AM

## 2011-08-17 NOTE — Progress Notes (Signed)
Agree with DC from acute PT.  Edmund Rick PT [phone]  

## 2011-08-17 NOTE — Progress Notes (Signed)
Pt discussed in long LOS meeting today.

## 2011-08-17 NOTE — Progress Notes (Signed)
9 Days Post-Op  Subjective: Doing better, +BM  Objective: Vital signs in last 24 hours: Temp:  [98 F (36.7 C)-98.2 F (36.8 C)] 98 F (36.7 C) (03/20 0551) Pulse Rate:  [81-97] 81  (03/20 0551) Resp:  [16-21] 18  (03/20 0551) BP: (112-122)/(57-78) 112/57 mmHg (03/20 0551) SpO2:  [95 %-99 %] 98 % (03/20 0551) Last BM Date: 08/08/11  Intake/Output from previous day: 03/19 0701 - 03/20 0700 In: 360 [P.O.:360] Out: 2005 [Urine:1975; Drains:30] Intake/Output this shift:    General appearance: alert and cooperative Resp: clear to auscultation bilaterally GI: Soft, +BS, drains minimal serosanguinous  Lab Results:  No results found for this basename: WBC:2,HGB:2,HCT:2,PLT:2 in the last 72 hours BMET  Basename 08/17/11 0617 08/16/11 0500  NA 137 139  K 3.7 3.0*  CL 97 98  CO2 31 35*  GLUCOSE 147* 171*  BUN 20 19  CREATININE 1.32 1.23  CALCIUM 8.9 8.5   PT/INR  Basename 08/17/11 0617 08/16/11 0500  LABPROT 20.2* 21.3*  INR 1.69* 1.81*   ABG No results found for this basename: PHART:2,PCO2:2,PO2:2,HCO3:2 in the last 72 hours  Studies/Results: Dg Chest 2 View  08/16/2011  *RADIOLOGY REPORT*  Clinical Data: History of interstitial edema.  Follow-up.  CHEST - 2 VIEW  Comparison: 09/14/2010.  08/11/2011.  Findings: There is stable mild cardiac silhouette enlargement. There is ectasia of thoracic aorta.  Surgical staples on the left appear unchanged.  There is an area of subsegmental atelectasis which has developed in the lower right lung.  There are low lung volumes.  No definite pulmonary edema or pleural effusion is seen. There is minimal degenerative spondylosis.  IMPRESSION: Stable cardiac silhouette enlargement.  Area of subsegmental atelectasis has developed in the lower right lung.  Hypoinflation of the lungs.  No definite pulmonary edema or pleural effusion is seen.  Original Report Authenticated By: Crawford Givens, M.D.    Anti-infectives: Anti-infectives     Start      Dose/Rate Route Frequency Ordered Stop   08/09/11 1330   ciprofloxacin (CIPRO) tablet 500 mg        500 mg Oral 2 times daily 08/09/11 1217 08/13/11 2058   08/08/11 1321   polymyxin B 500,000 Units, bacitracin 50,000 Units in sodium chloride irrigation 0.9 % 500 mL irrigation  Status:  Discontinued          As needed 08/08/11 1322 08/08/11 1415   08/07/11 1400   ceFAZolin (ANCEF) IVPB 2 g/50 mL premix        2 g 100 mL/hr over 30 Minutes Intravenous 60 min pre-op 08/07/11 1346 08/08/11 1107          Assessment/Plan: s/p Procedure(s) (LRB): HERNIA REPAIR INCISIONAL (N/A) INSERTION OF MESH (N/A) POD #8 FEN - tolerating diet, Home K dose  Pulm - home CPAP, slow progress, appreciate Dr. Evlyn Kanner seeing patient, BNP noted Renal - improved Mobilizing with PT - plan HHPT DM - CBG AC and HS, home lantus dose and carb mod diet VTE - Lovenox + coumadin  LOS: 9 days    Lawrence Mitch E 08/17/2011

## 2011-08-17 NOTE — Progress Notes (Signed)
ANTICOAGULATION CONSULT NOTE - Follow Up Consult  Pharmacy Consult:  Coumadin Indication: history of PE  No Known Allergies  Patient Measurements: Height: 5\' 11"  (180.3 cm) Weight: 255 lb 11.7 oz (116 kg) IBW/kg (Calculated) : 75.3   Vital Signs: Temp: 98 F (36.7 C) (03/20 0551) BP: 112/57 mmHg (03/20 0551) Pulse Rate: 81  (03/20 0551)  Labs:  Basename 08/17/11 0617 08/16/11 0500 08/15/11 0530  HGB -- -- --  HCT -- -- --  PLT -- -- --  APTT -- -- --  LABPROT 20.2* 21.3* 17.5*  INR 1.69* 1.81* 1.41  HEPARINUNFRC -- -- --  CREATININE 1.32 1.23 1.39*  CKTOTAL -- -- --  CKMB -- -- --  TROPONINI -- -- --   Estimated Creatinine Clearance: 65.5 ml/min (by C-G formula based on Cr of 1.32).   Medications:  Scheduled:     . diltiazem  120 mg Oral Daily  . enoxaparin  40 mg Subcutaneous Q24H  . feeding supplement  1 Container Oral TID WC  . furosemide  40 mg Oral TID  . insulin aspart  0-15 Units Subcutaneous TID WC  . insulin glargine  16 Units Subcutaneous q morning - 10a  . insulin glargine  22 Units Subcutaneous QPM  . levothyroxine  25 mcg Oral QAC breakfast  . methotrexate  15 mg Oral Weekly  . pantoprazole  40 mg Oral Q1200  . polyethylene glycol  17 g Oral Daily  . potassium chloride  10 mEq Intravenous Q1 Hr x 2  . potassium chloride  60 mEq Oral TID  . warfarin  4 mg Oral ONCE-1800  . Warfarin - Pharmacist Dosing Inpatient   Does not apply q1800     Assessment: 42 YOM admitted 08/08/11 with large incisional hernia s/p duodenal perforation repair.  Coumadin resumed 3/15 for history of PE.  INR trending down again, likely d/t Cipro being cleared from the body.  No bleeding documented.  Goal of Therapy:  INR 2-3    Plan:  - Coumadin 7.5mg  PO today - Continue Lovenox until INR >/= 2 - Daily PT/INR - Consider resuming home med glipizide    Phillips Climes, PharmD 08/17/2011,1:01 PM

## 2011-08-18 LAB — BASIC METABOLIC PANEL
BUN: 18 mg/dL (ref 6–23)
Calcium: 8.7 mg/dL (ref 8.4–10.5)
Chloride: 99 mEq/L (ref 96–112)
Creatinine, Ser: 1.22 mg/dL (ref 0.50–1.35)
GFR calc Af Amer: 67 mL/min — ABNORMAL LOW (ref >90)
GFR calc non Af Amer: 57 mL/min — ABNORMAL LOW (ref >90)

## 2011-08-18 LAB — PROTIME-INR: Prothrombin Time: 20.7 seconds — ABNORMAL HIGH (ref 11.6–15.2)

## 2011-08-18 LAB — GLUCOSE, CAPILLARY: Glucose-Capillary: 198 mg/dL — ABNORMAL HIGH (ref 70–99)

## 2011-08-18 MED ORDER — GLIPIZIDE 10 MG PO TABS
10.0000 mg | ORAL_TABLET | Freq: Every day | ORAL | Status: DC
  Administered 2011-08-18 – 2011-08-19 (×2): 10 mg via ORAL
  Filled 2011-08-18 (×3): qty 1

## 2011-08-18 MED ORDER — WARFARIN SODIUM 7.5 MG PO TABS
7.5000 mg | ORAL_TABLET | Freq: Once | ORAL | Status: AC
  Administered 2011-08-18: 7.5 mg via ORAL
  Filled 2011-08-18: qty 1

## 2011-08-18 NOTE — Progress Notes (Signed)
Results for AXL, RODINO (MRN [id number]) as of 08/18/2011 13:51  Ref. Range 08/17/2011 08:19 08/17/2011 12:16 08/17/2011 17:34 08/17/2011 21:56  Glucose-Capillary Latest Range: 70-99 mg/dL 409 (H) 811 (H) 914 (H) 207 (H)     Noted postprandial CBGs elevated. Patient takes meal coverage at home per home med rec. PO Intake 100% of meals.   Please consider restarting patent's home doses of meal coverage.  Novolog 3 units with breakfast       2 units with lunch       4 units with supper  Will follow. Ambrose Finland RN, MSN, CDE Diabetes Coordinator Inpatient Diabetes Program [phone]

## 2011-08-18 NOTE — Progress Notes (Addendum)
ANTICOAGULATION CONSULT NOTE - Follow Up Consult  Pharmacy Consult:  Coumadin Indication: history of PE  No Known Allergies  Patient Measurements: Height: 5\' 11"  (180.3 cm) Weight: 255 lb 11.7 oz (116 kg) IBW/kg (Calculated) : 75.3   Vital Signs: Temp: 98.2 F (36.8 C) (03/21 0534) Temp src: Axillary (03/21 0534) BP: 115/66 mmHg (03/21 0534) Pulse Rate: 80  (03/21 0534)  Labs:  Alvira Philips 08/18/11 0615 08/17/11 0617 08/16/11 0500  HGB -- -- --  HCT -- -- --  PLT -- -- --  APTT -- -- --  LABPROT 20.7* 20.2* 21.3*  INR 1.74* 1.69* 1.81*  HEPARINUNFRC -- -- --  CREATININE 1.22 1.32 1.23  CKTOTAL -- -- --  CKMB -- -- --  TROPONINI -- -- --   Estimated Creatinine Clearance: 70.9 ml/min (by C-G formula based on Cr of 1.22).   Medications:  Scheduled:     . diltiazem  120 mg Oral Daily  . enoxaparin  40 mg Subcutaneous Q24H  . feeding supplement  1 Container Oral TID WC  . furosemide  40 mg Oral TID  . glipiZIDE  10 mg Oral QAC breakfast  . insulin aspart  0-15 Units Subcutaneous TID WC  . insulin glargine  16 Units Subcutaneous q morning - 10a  . insulin glargine  22 Units Subcutaneous QPM  . levothyroxine  25 mcg Oral QAC breakfast  . methotrexate  15 mg Oral Weekly  . pantoprazole  40 mg Oral Q1200  . polyethylene glycol  17 g Oral Daily  . potassium chloride  60 mEq Oral TID  . warfarin  7.5 mg Oral ONCE-1800  . Warfarin - Pharmacist Dosing Inpatient   Does not apply q1800     Assessment: 13 YOM admitted 08/08/11 with large incisional hernia s/p duodenal perforation repair.  Coumadin resumed 3/15 for history of PE.  INR trending towards goal after warfarin dose increase yesterday to compensate for that fact that Cipro is no longer on board (d/c'd 3/16)  No bleeding documented.  Goal of Therapy:  INR 2-3    Plan:  - Coumadin 7.5mg  PO today - Continue Lovenox until INR >/= 2 - Daily PT/INR  Jill Side L. Illene Bolus, PharmD, BCPS Clinical Pharmacist Pager:  [phone] 08/18/2011 12:57 PM

## 2011-08-18 NOTE — Progress Notes (Signed)
Pt. Has home CPAP and states he is able to place on and off by himself.

## 2011-08-18 NOTE — Progress Notes (Signed)
10 Days Post-Op  Subjective: Did PT well off O2 yesterday, moving bowels  Objective: Vital signs in last 24 hours: Temp:  [98.2 F (36.8 C)-98.6 F (37 C)] 98.2 F (36.8 C) (03/21 0534) Pulse Rate:  [80-83] 80  (03/21 0534) Resp:  [16-18] 18  (03/21 0534) BP: (97-126)/(43-72) 115/66 mmHg (03/21 0534) SpO2:  [97 %-100 %] 97 % (03/21 0534) Last BM Date: 08/17/11  Intake/Output from previous day: 03/20 0701 - 03/21 0700 In: -  Out: 1310 [Urine:1300; Drains:10] Intake/Output this shift:    General appearance: alert and cooperative Resp: clear to auscultation bilaterally GI: incision CDI, drains minimal  Lab Results:  No results found for this basename: WBC:2,HGB:2,HCT:2,PLT:2 in the last 72 hours BMET  [facility] 08/18/11 0615 08/17/11 0617  NA 139 137  K 3.5 3.7  CL 99 97  CO2 32 31  GLUCOSE 124* 147*  BUN 18 20  CREATININE 1.22 1.32  CALCIUM 8.7 8.9   PT/INR  Basename 08/18/11 0615 08/17/11 0617  LABPROT 20.7* 20.2*  INR 1.74* 1.69*   ABG No results found for this basename: PHART:2,PCO2:2,PO2:2,HCO3:2 in the last 72 hours  Studies/Results: Dg Chest 2 View  08/16/2011  *RADIOLOGY REPORT*  Clinical Data: History of interstitial edema.  Follow-up.  CHEST - 2 VIEW  Comparison: 09/14/2010.  08/11/2011.  Findings: There is stable mild cardiac silhouette enlargement. There is ectasia of thoracic aorta.  Surgical staples on the left appear unchanged.  There is an area of subsegmental atelectasis which has developed in the lower right lung.  There are low lung volumes.  No definite pulmonary edema or pleural effusion is seen. There is minimal degenerative spondylosis.  IMPRESSION: Stable cardiac silhouette enlargement.  Area of subsegmental atelectasis has developed in the lower right lung.  Hypoinflation of the lungs.  No definite pulmonary edema or pleural effusion is seen.  Original Report Authenticated By: Crawford Givens, M.D.    Anti-infectives: Anti-infectives     Start      Dose/Rate Route Frequency Ordered Stop   08/09/11 1330   ciprofloxacin (CIPRO) tablet 500 mg        500 mg Oral 2 times daily 08/09/11 1217 08/13/11 2058   08/08/11 1321   polymyxin B 500,000 Units, bacitracin 50,000 Units in sodium chloride irrigation 0.9 % 500 mL irrigation  Status:  Discontinued          As needed 08/08/11 1322 08/08/11 1415   08/07/11 1400   ceFAZolin (ANCEF) IVPB 2 g/50 mL premix        2 g 100 mL/hr over 30 Minutes Intravenous 60 min pre-op 08/07/11 1346 08/08/11 1107          Assessment/Plan: s/p Procedure(s) (LRB): HERNIA REPAIR INCISIONAL (N/A) INSERTION OF MESH (N/A) POD #9 FEN - tolerating diet, Home K dose  Pulm - follow RA sats today Renal - improved PT signed off - D/C HHPT DM - add home glipizide VTE - Lovenox + coumadin D/C tomorrow if INR 2+   10 Days Post-Op

## 2011-08-19 LAB — GLUCOSE, CAPILLARY: Glucose-Capillary: 130 mg/dL — ABNORMAL HIGH (ref 70–99)

## 2011-08-19 LAB — PROTIME-INR: INR: 1.84 — ABNORMAL HIGH (ref 0.00–1.49)

## 2011-08-19 MED ORDER — OXYCODONE-ACETAMINOPHEN 5-325 MG PO TABS: 1.0000 | ORAL_TABLET | ORAL | Status: DC | PRN

## 2011-08-19 NOTE — Progress Notes (Signed)
11 Days Post-Op  Subjective: Doing well  Objective: Vital signs in last 24 hours: Temp:  [97.7 F (36.5 C)-98.5 F (36.9 C)] 98.2 F (36.8 C) (03/22 0558) Pulse Rate:  [91-95] 91  (03/22 0558) Resp:  [18-20] 20  (03/22 0558) BP: (119-136)/(59-71) 119/59 mmHg (03/22 0558) SpO2:  [94 %-99 %] 94 % (03/22 0558) Last BM Date: 08/18/11  Intake/Output from previous day: 03/21 0701 - 03/22 0700 In: 900 [P.O.:900] Out: 2718 [Urine:2675; Drains:43] Intake/Output this shift:    General appearance: alert and cooperative Resp: clear to auscultation bilaterally GI: incision CDI, soft, +BS  Lab Results:  No results found for this basename: WBC:2,HGB:2,HCT:2,PLT:2 in the last 72 hours BMET  [facility] 08/18/11 0615 08/17/11 0617  NA 139 137  K 3.5 3.7  CL 99 97  CO2 32 31  GLUCOSE 124* 147*  BUN 18 20  CREATININE 1.22 1.32  CALCIUM 8.7 8.9   PT/INR  Basename 08/19/11 0650 08/18/11 0615  LABPROT 21.6* 20.7*  INR 1.84* 1.74*   ABG No results found for this basename: PHART:2,PCO2:2,PO2:2,HCO3:2 in the last 72 hours  Studies/Results: No results found.  Anti-infectives: Anti-infectives     Start     Dose/Rate Route Frequency Ordered Stop   08/09/11 1330   ciprofloxacin (CIPRO) tablet 500 mg        500 mg Oral 2 times daily 08/09/11 1217 08/13/11 2058   08/08/11 1321   polymyxin B 500,000 Units, bacitracin 50,000 Units in sodium chloride irrigation 0.9 % 500 mL irrigation  Status:  Discontinued          As needed 08/08/11 1322 08/08/11 1415   08/07/11 1400   ceFAZolin (ANCEF) IVPB 2 g/50 mL premix        2 g 100 mL/hr over 30 Minutes Intravenous 60 min pre-op 08/07/11 1346 08/08/11 1107          Assessment/Plan: s/p Procedure(s) (LRB): HERNIA REPAIR INCISIONAL (N/A) INSERTION OF MESH (N/A) Doing well Will D/C after Lovenox dose today  LOS: 11 days    Texanna Hilburn E 08/19/2011

## 2011-08-19 NOTE — Progress Notes (Signed)
DC home with JP drains x2. JP instruction sheet given to pt...  He states he can take care of them with no problem. Verbally understands DC instructions with follow up appts.Marland Kitchen

## 2011-08-19 NOTE — Discharge Summary (Signed)
Physician Discharge Summary  Patient ID: Fred Alvarez MRN: [id number] DOB/AGE: [birth year] 71 y.o.  Admit date: 08/08/2011 Discharge date: 08/19/2011  Admission Diagnoses:  Discharge Diagnoses:  Active Problems:  * No active hospital problems. *    Discharged Condition: good  Hospital Course: Patient underwent repair of incisional hernia with mesh and component separation as a combined procedure with Dr. Kelly Splinter. Dr. Kelly Splinter also excised a chronic nonhealing wound. Postoperatively the patient had a mild expected ileus. This improved gradually. He had some difficulties with fluid retention requiring extra Lasix doses. His blood sugars were managed as his diet he danced. Dr. Evlyn Kanner consulted to assist with managing his oxygen requirements. He wore his CPAP at night. He was able to be weaned off nasal cannula oxygen during the day. He progressed well with physical therapy and was eventually cleared. He was restarted on his Coumadin and covered with Lovenox as his INR rose. He is near goal on INR and will receive a Lovenox dose prior to discharge today. He is discharged home in stable condition. He was instructed in JP care.  Consults: IM  Significant Diagnostic Studies:   Treatments: surgery: repair incisional hernia with mesh and component separation  Discharge Exam: Blood pressure 119/59, pulse 91, temperature 98.2 F (36.8 C), temperature source Oral, resp. rate 20, height 5\' 11"  (1.803 m), weight 116 kg (255 lb 11.7 oz), SpO2 94.00%. A&O Lungs CTA CV Reg Abd incision CDI  Disposition: 01-Home or Self Care  Discharge Orders    Future Appointments: Provider: Department: Dept Phone: Center:   09/14/2011 8:00 AM [facility] [phone] [facility]     Future Orders Please Complete By Expires   Diet - low sodium heart healthy      Increase activity slowly      Discharge instructions      Comments:   CCS _______[facility]   HERNIA REPAIR:  POST OP INSTRUCTIONS  Always review your discharge instruction sheet given to you by the facility where your surgery was performed. IF YOU HAVE DISABILITY OR FAMILY LEAVE FORMS, YOU MUST BRING THEM TO THE OFFICE FOR PROCESSING.   DO NOT GIVE THEM TO YOUR DOCTOR.  A  prescription for pain medication may be given to you upon discharge.  Take your pain medication as prescribed, if needed.  If narcotic pain medicine is not needed, then you may take acetaminophen (Tylenol) or ibuprofen (Advil) as needed. Take your usually prescribed medications unless otherwise directed. If you need a refill on your pain medication, please contact your pharmacy.  They will contact our office to request authorization. Prescriptions will not be filled after 5 pm or on week-ends. .  It is common to experience some constipation if taking pain medication after surgery.  Increasing fluid intake and taking a stool softener (such as Colace) will usually help or prevent this problem from occurring.  A mild laxative (Milk of Magnesia or Miralax) should be taken according to package directions if there are no bowel movements after 48 hours.  ACTIVITIES:  You may resume regular (light) daily activities beginning the next day--such as daily self-care, walking, climbing stairs--gradually increasing activities as tolerated.  You may have sexual intercourse when it is comfortable.  Refrain from any heavy lifting or straining for 6 weeks. You may drive when you are no longer taking prescription pain medication, you can comfortably wear a seatbelt, and you can safely maneuver your car and apply brakes. RETURN TO WORK:  __________________________________________________________ Bonita Quin should  see your doctor in the office for a follow-up appointment approximately 2-3 weeks after your surgery.  Make sure that you call for this appointment within a day or two after you arrive home to insure a convenient appointment time. OTHER INSTRUCTIONS:   __________________________________________________________________________________________________________________________________________________________________________________________  WHEN TO CALL YOUR DOCTOR: Fever over 101.0 Inability to urinate Nausea and/or vomiting Extreme swelling or bruising Continued bleeding from incision. Increased pain, redness, or drainage from the incision  The clinic staff is available to answer your questions during regular business hours.  Please don't hesitate to call and ask to speak to one of the nurses for clinical concerns.  If you have a medical emergency, go to the nearest emergency room or call 911.  A surgeon from [facility] is always on call at the hospital   [address], [place], [place]  [address] ?  [address] [address], [place], [place]   [address] [phone] ? [phone] ? FAX [phone] Web site: [email or web address]   Discharge wound care:      Comments:   Record drain output Cover incision for comfort with binder Wear binder     Medication List  As of 08/19/2011  7:42 AM   STOP taking these medications         doxycycline 100 MG tablet         TAKE these medications         APIDRA 100 UNIT/ML injection   Generic drug: insulin glulisine   Inject 3-16 Units into the skin 3 (three) times daily before meals. 3 units and breakfast, 2 units at lunch; 4 units at supper ; and an additional 2 units  Per 50 over 100 AC SSI      CARDIZEM CD 120 MG 24 hr capsule   Generic drug: diltiazem   Take 120 mg by mouth daily.      febuxostat 40 MG tablet   Commonly known as: ULORIC   Take 40 mg by mouth daily.      folic acid 400 MCG tablet   Commonly known as: FOLVITE   Take 800-1,200 mcg by mouth daily. Take 2-3 tablets daily alternating days      furosemide 40 MG tablet   Commonly known as: LASIX   Take 40 mg by mouth 3 (three) times daily.      glipiZIDE 10 MG tablet   Commonly known  as: GLUCOTROL   Take 10 mg by mouth 2 (two) times daily before a meal.      guaiFENesin 100 MG/5ML Soln   Commonly known as: ROBITUSSIN   Take 30 mLs by mouth every 6 (six) hours as needed. FOR MUCOUS      HUMIRA 40 MG/0.8ML injection   Generic drug: adalimumab   Inject 40 mg into the skin every 14 (fourteen) days.      insulin glargine 100 UNIT/ML injection   Commonly known as: LANTUS   Inject 16-22 Units into the skin 2 (two) times daily. Take 16 units in am and 22 units in pm      INSULIN SYRINGE 1CC/31GX5/16" 31G X 5/16" 1 ML Misc      levothyroxine 25 MCG tablet   Commonly known as: SYNTHROID, LEVOTHROID   Take 25 mcg by mouth daily.      methotrexate 2.5 MG tablet   Commonly known as: RHEUMATREX   Take 15 mg by mouth once a week. On Tuesday      mulitivitamin with minerals Tabs   Take 1  tablet by mouth daily.      omeprazole 20 MG capsule   Commonly known as: PRILOSEC   Take 20 mg by mouth daily.      oxyCODONE-acetaminophen 5-325 MG per tablet   Commonly known as: PERCOCET   Take 1-2 tablets by mouth every 4 (four) hours as needed.      potassium chloride SA 20 MEQ tablet   Commonly known as: K-DUR,KLOR-CON   Take 60 mEq by mouth 3 (three) times daily.      simvastatin 40 MG tablet   Commonly known as: ZOCOR   Take 40 mg by mouth at bedtime.      triamterene 50 MG capsule   Commonly known as: DYRENIUM   Take 50 mg by mouth daily.      Vitamin D (Ergocalciferol) 50000 UNITS Caps   Commonly known as: DRISDOL   Take 50,000 Units by mouth every 7 (seven) days. Friday      warfarin 3 MG tablet   Commonly known as: COUMADIN   Take 3 mg by mouth daily.           Follow-up Information    Follow up with [facility], DO in 1 week.   Contact information:   [address] [place] [address] [phone]       Follow up with Liz Malady, MD. (office will call)    Contact information:   [facility] [address] [place] [address] [phone]          Signed: Liz Malady 08/19/2011, 7:42 AM

## 2011-08-19 NOTE — Progress Notes (Signed)
UR of chart complete.  

## 2011-08-24 ENCOUNTER — Encounter (INDEPENDENT_AMBULATORY_CARE_PROVIDER_SITE_OTHER): Payer: Self-pay | Admitting: General Surgery

## 2011-08-24 ENCOUNTER — Ambulatory Visit (INDEPENDENT_AMBULATORY_CARE_PROVIDER_SITE_OTHER): Payer: BC Managed Care – PPO | Admitting: General Surgery

## 2011-08-24 VITALS — BP 126/77 | HR 88 | Temp 98.0°F | Ht 72.0 in | Wt 254.0 lb

## 2011-08-24 DIAGNOSIS — K432 Incisional hernia without obstruction or gangrene: Secondary | ICD-10-CM

## 2011-08-24 NOTE — Progress Notes (Signed)
Subjective:     Patient ID: Fred Alvarez, male   DOB: [birth year], 71 y.o.   MRN: [id number]  HPI Patient underwent repair of incisional hernia with component separation and excision of unhealing wound combined with Dr. Kelly Splinter on March 11. He is doing well. He is not taking any pain medication. He says his drains in place which drained 5 cc or less. he is set to see Dr. Kelly Splinter at the end of the week  Review of Systems     Objective:   Physical Exam Abdomen is soft. Incision is clean dry and intact. Hernia repair is intact. Drains had minimal output. Underwent reapplied.    Assessment:     Doing very well status post incisional hernia repair    Plan:     Continue to avoid heavy lifting for a total of 8 weeks after surgery. I will see him back in a couple weeks. He is following up with Dr.Tisovec for Coumadin management as well as his Lasix management. This was increased recently and he has been able to lose a good deal of water weight

## 2011-08-30 ENCOUNTER — Telehealth (INDEPENDENT_AMBULATORY_CARE_PROVIDER_SITE_OTHER): Payer: Self-pay | Admitting: General Surgery

## 2011-08-30 NOTE — Telephone Encounter (Signed)
Patient called requesting RTW note, message was directed to the incorrect nurse for Dr. Janee Morn.

## 2011-08-31 ENCOUNTER — Encounter (INDEPENDENT_AMBULATORY_CARE_PROVIDER_SITE_OTHER): Payer: Self-pay

## 2011-09-14 ENCOUNTER — Ambulatory Visit (INDEPENDENT_AMBULATORY_CARE_PROVIDER_SITE_OTHER): Payer: BC Managed Care – PPO | Admitting: General Surgery

## 2011-09-14 ENCOUNTER — Encounter (HOSPITAL_BASED_OUTPATIENT_CLINIC_OR_DEPARTMENT_OTHER): Payer: BC Managed Care – PPO

## 2011-09-14 ENCOUNTER — Encounter (INDEPENDENT_AMBULATORY_CARE_PROVIDER_SITE_OTHER): Payer: Self-pay | Admitting: General Surgery

## 2011-09-14 VITALS — BP 126/80 | HR 88 | Temp 98.1°F | Resp 18 | Ht 73.0 in | Wt 248.0 lb

## 2011-09-14 DIAGNOSIS — K432 Incisional hernia without obstruction or gangrene: Secondary | ICD-10-CM

## 2011-09-14 NOTE — Progress Notes (Signed)
Subjective:     Patient ID: Fred Alvarez, male   DOB: 1941-03-12, 71 y.o.   MRN: [id number]  HPI Patient presents for followup status post incisional hernia repair with component separation and excision of nonhealing wound which was then together with Dr. Kelly Splinter. Patient is doing very well. His drains are out. I spoke with Dr. Kelly Splinter and she is happy with his wound at this time. He has returned to work. He is still completely avoiding all lifting.  Review of Systems     Objective:   Physical Exam Abdomen is soft. Hernia repair is intact. There is a little pink area where his binder was rubbing on the central portion of this wound but this is improving. Bowel sounds are active. There is no tenderness.    Assessment:     Doing very well after complex incisional hernia repair with component separation and excision of nonhealing wound    Plan:     Continue to avoid heavy lifting for a total of 6 weeks after surgery. I will see him next month.

## 2011-10-12 ENCOUNTER — Encounter (INDEPENDENT_AMBULATORY_CARE_PROVIDER_SITE_OTHER): Payer: Self-pay | Admitting: General Surgery

## 2011-10-12 ENCOUNTER — Ambulatory Visit (INDEPENDENT_AMBULATORY_CARE_PROVIDER_SITE_OTHER): Payer: BC Managed Care – PPO | Admitting: General Surgery

## 2011-10-12 VITALS — BP 138/82 | HR 78 | Temp 97.9°F | Resp 18 | Ht 73.0 in | Wt 253.5 lb

## 2011-10-12 DIAGNOSIS — K432 Incisional hernia without obstruction or gangrene: Secondary | ICD-10-CM

## 2011-10-12 NOTE — Progress Notes (Signed)
Subjective:     Patient ID: Fred Alvarez, male   DOB: 09-29-1940, 71 y.o.   MRN: [id number]  HPI Patient presents for followup of complex incisional hernia repair with component separation and mesh as well as excision of chronic wound done together with Dr. Kelly Splinter. He has been feeling very well. He is back to work. He has more energy than he has felt in a long time  Review of Systems     Objective:   Physical Exam  Cardiovascular: Normal rate and normal heart sounds.   Pulmonary/Chest: Effort normal and breath sounds normal.  Abdomen is soft. Incision is well-healed. Hernia repair is intact. There is no significant tenderness. There is some scar tissue palpable along his incision.     Assessment:     Doing very well after repair of complex incisional hernia    Plan:     Continue followup as scheduled with Dr.Sanger. Avoid heavy lifting for a total of 8 weeks after surgery. Return p.r.n.

## 2011-11-02 NOTE — Op Note (Signed)
NAMEMATTEW, Fred NO.:  [id number]  MEDICAL RECORD NO.:  [id number]  LOCATION: [facility]                        FACILITY:[facility]  PHYSICIAN:  Wayland Denis, DO      DATE OF BIRTH:  1940-08-14  DATE OF PROCEDURE:  08/08/2011 DATE OF DISCHARGE:                              OPERATIVE REPORT   PREOPERATIVE DIAGNOSIS:  Incisional hernia with abdominal wall defect.  POSTOPERATIVE DIAGNOSIS:  Incisional hernia with abdominal wall defect.  PROCEDURE:  Excision of chronic abdominal skin wound.  SURGEON:  Gabrielle Dare. Janee Morn, MD  ASSISTANT:  Alan Ripper Sanger, DO  ANESTHESIA:  General.  INDICATION FOR PROCEDURE:  The patient has had a large incisional hernia secondary to repair of bowel perforation.  He had nonhealing wound with the hernia.  It was felt that we could get the hernia reduced and the incision repaired primarily and he agreed.  The risks and complications were discussed and reviewed and included infection, bleeding, pain, scar, and the risk of anesthesia.  Consent was confirmed and signed and the patient was taken to the operating room.  DESCRIPTION OF PROCEDURE:  The patient was placed on the operating room table, anesthesia was administered.  Once adequate, a time-out was called.  All information was confirmed to be correct.  She was prepped and draped in usual sterile fashion.  General Surgery performed their portion of the case which included repair of the hernia.  Once that was complete, I excised the chronic wound tissue and assisted in closing the area with a layered closure, which included 3-0 Vicryl, 4-0 Vicryl and 4- 0 Monocryl for the skin with Dermabond on top.  The patient tolerated the procedure well. There were no complications.  He was awoken and taken to recovery room in stable condition.     Wayland Denis, DO     CS/MEDQ  D:  11/02/2011  T:  11/02/2011  Job:  [id number]

## 2011-12-04 IMAGING — CT CT ABDOMEN W/ CM
2 of 5 series · 17 of 46 positions shown, 19 images · IV contrast (APPLIED)
Comparison: 07/12/2009

CLINICAL DATA: Perforated ulcer

CT ABDOMEN WITH CONTRAST
TECHNIQUE: Multidetector CT imaging of the abdomen was performed
using the standard protocol following bolus administration of
intravenous contrast.
Contrast: 150 ml 4mnipaque-433 IV

[Series 2: abd/ with 5.0 b31f st · axial · 0.95mm/px · z∈[-326,-2]mm · 14 of 75 slices shown, 16 images]
[im 5/75  soft-tissue]
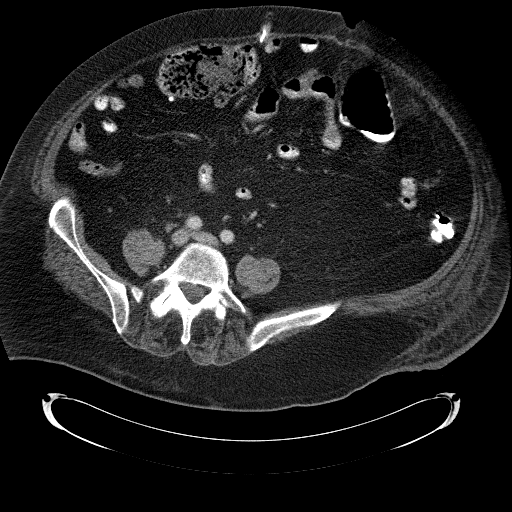
[im 5/75  bone]
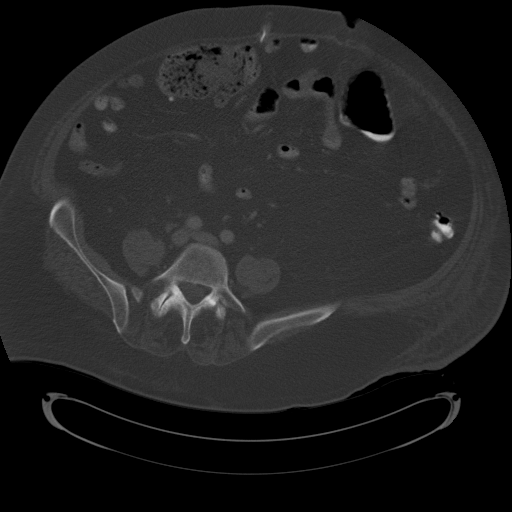
[im 10/75  soft-tissue]
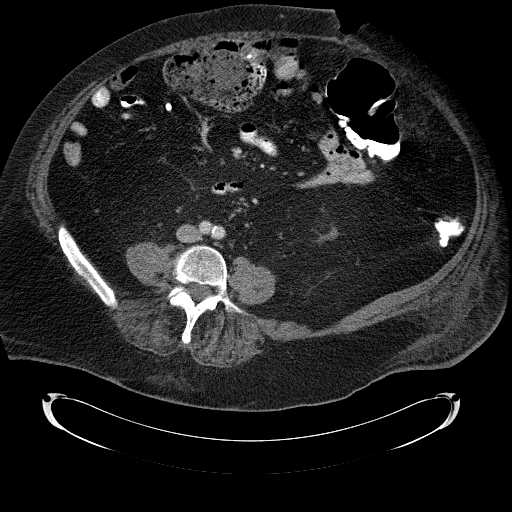
[im 14/75  soft-tissue]
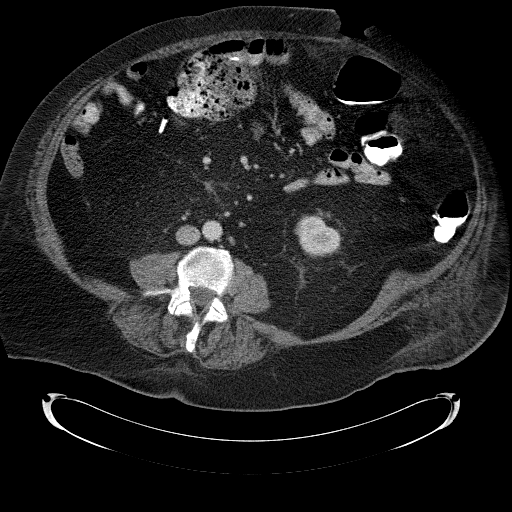
[im 19/75  soft-tissue]
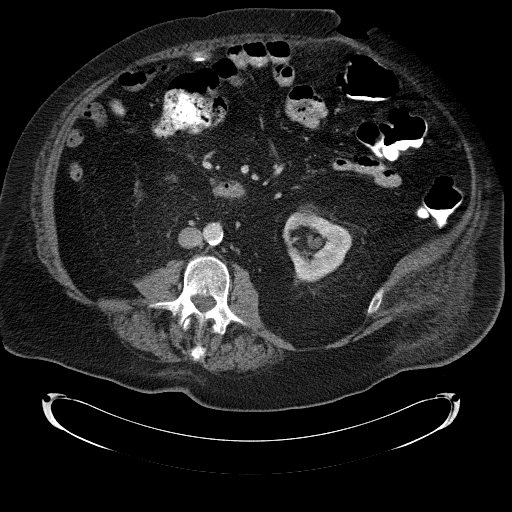
[im 24/75  soft-tissue]
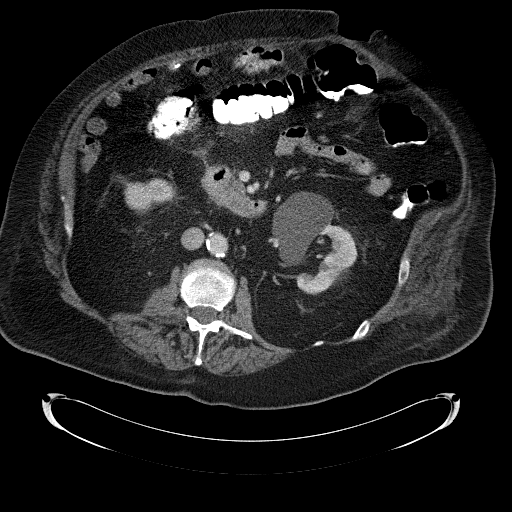
[im 28/75  soft-tissue]
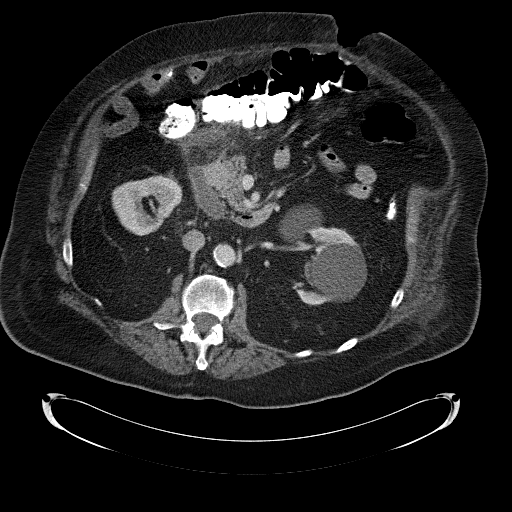
[im 33/75  soft-tissue]
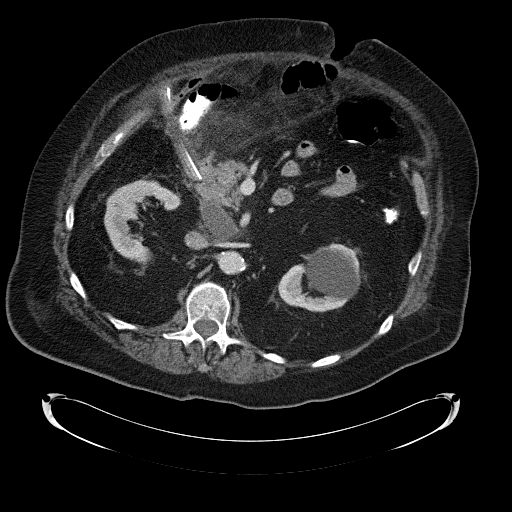
[im 42/75  soft-tissue]
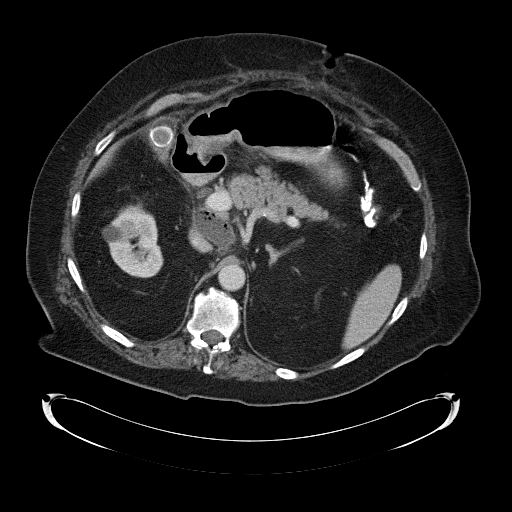
[im 47/75  soft-tissue]
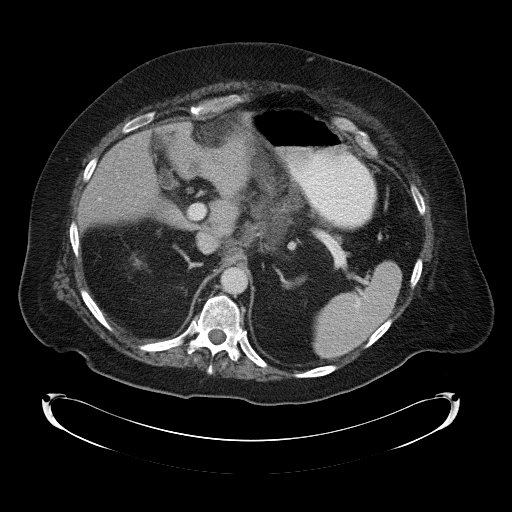
[im 47/75  bone]
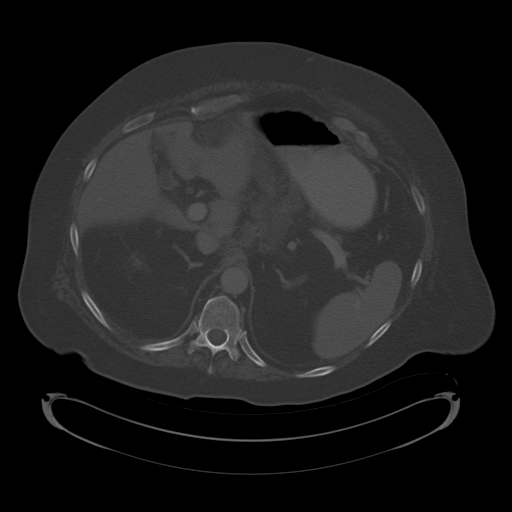
[im 51/75  soft-tissue]
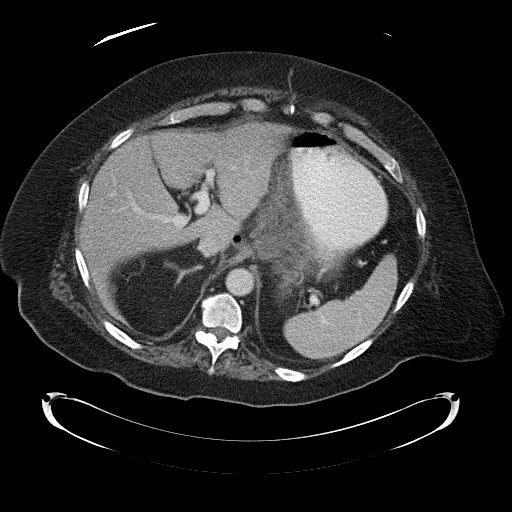
[im 56/75  soft-tissue]
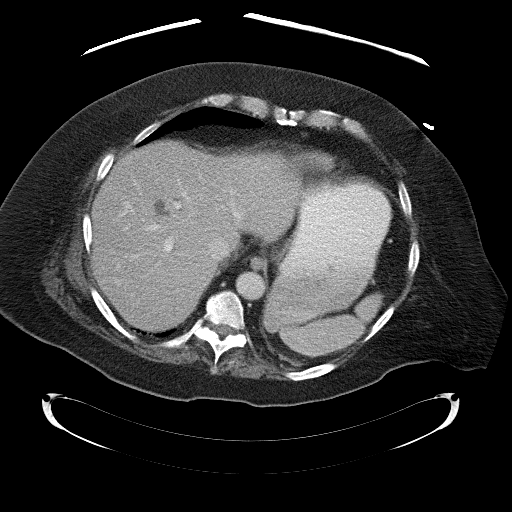
[im 61/75  soft-tissue]
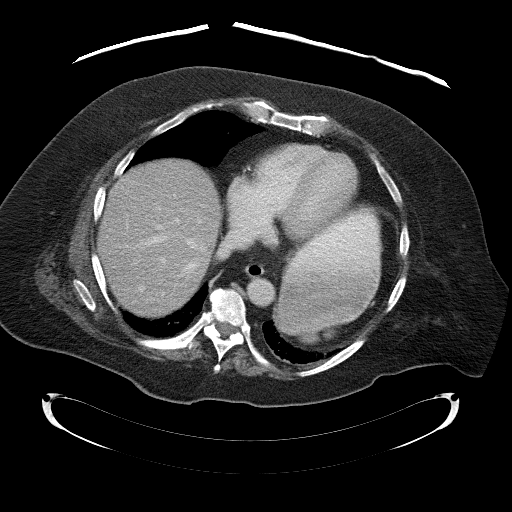
[im 65/75  soft-tissue]
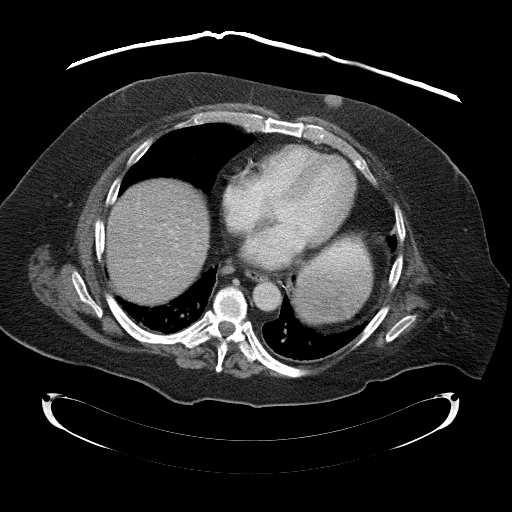
[im 70/75  soft-tissue]
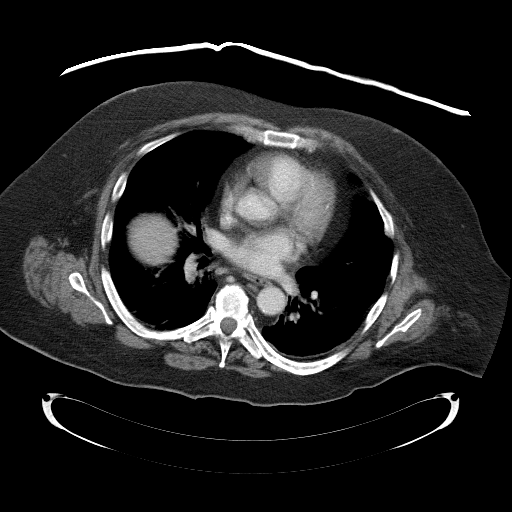

[Series 602: cor · coronal · 0.95mm/px · 3 of 109 slices shown]
[im 37/109  soft-tissue]
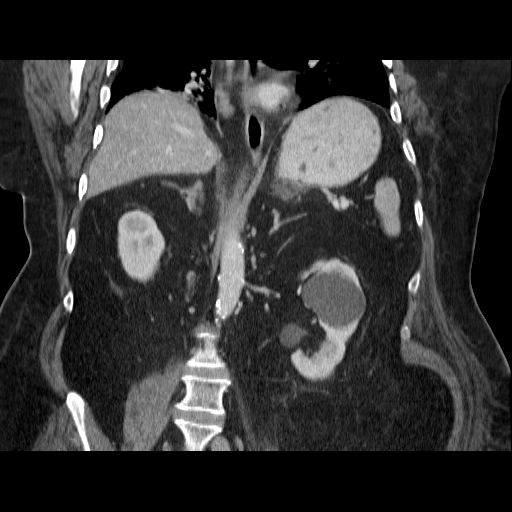
[im 49/109  soft-tissue]
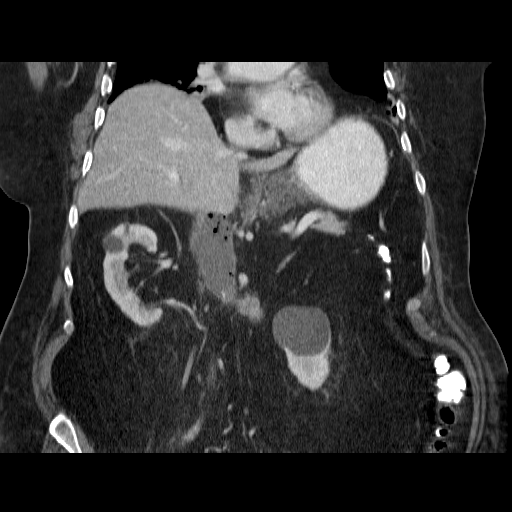
[im 61/109  soft-tissue]
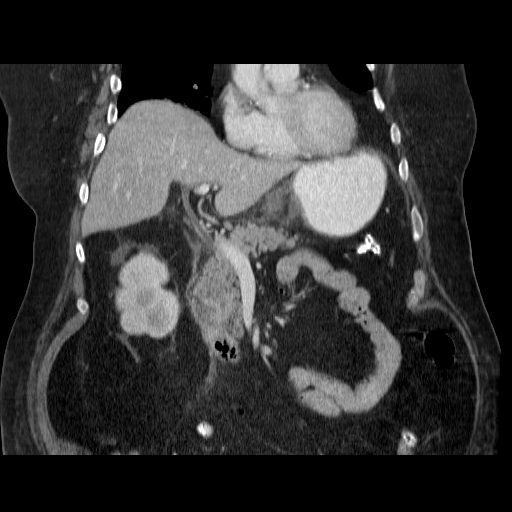

[17 of 46 positions shown; findings below may reference images not displayed]

FINDINGS: Minimal dependent atelectasis in the visualized lung
bases.  Coronary calcifications and patchy aortic calcifications
are evident.  Stable 15 mm probable cyst in the anterior right
hepatic segment.  No new liver lesion.  2 large peripherally
calcified gallstones are noted in the fundus of the nondistended
gallbladder.

The surgical drain projects inferolateral to the proximal duodenum.
There has been some interval increase in size of the gas and fluid
collection posterior to the third portion of the duodenum, now
measuring 3 x 5.5 cm compared to 2.1 x 4.4 cm on the previous
study; this collection now extends cephalad, posteromedial to the
caudate lobe of the liver.  There is a new 21 x 34 mm fluid
collection projecting just inferior to the surgical drain.
Phlegmon in the gastrohepatic ligament is stable without discrete
or drainable fluid collection.

Unremarkable spleen and adrenal glands.  Stable bilateral renal
cysts.  No hydronephrosis.  Atheromatous nondilated aorta.  No free
air.  No ascites.  Open anterior abdominal wall wound extends down
to the fascia.  Visualized portions of the small bowel and colon
are decompressed.  No mesenteric or retroperitoneal adenopathy.
IMPRESSION: 1.  Interval increase in size of paraduodenal abscess now extending
cephalad adjacent to the caudate lobe of the liver.
2.  Small fluid collection now noted inferior to the surgical
drain.
3.  Cholelithiasis.

## 2013-05-14 ENCOUNTER — Ambulatory Visit (INDEPENDENT_AMBULATORY_CARE_PROVIDER_SITE_OTHER): Payer: Managed Care, Other (non HMO) | Admitting: Podiatry

## 2013-05-14 ENCOUNTER — Encounter: Payer: Self-pay | Admitting: Podiatry

## 2013-05-14 VITALS — BP 119/70 | HR 83 | Resp 18

## 2013-05-14 DIAGNOSIS — M79609 Pain in unspecified limb: Secondary | ICD-10-CM

## 2013-05-14 DIAGNOSIS — B351 Tinea unguium: Secondary | ICD-10-CM

## 2013-05-14 NOTE — Progress Notes (Signed)
   Subjective:    Patient ID: Fred Alvarez, male    DOB: Feb 24, 1941, 72 y.o.   MRN: [id number]  HPI Comments: Toenail trim     Review of Systems     Objective:   Physical Exam: Pulses are palpable bilateral. Nails are thick yellow dystrophic clinically mycotic and painful palpation.        Assessment & Plan:  Assessment: Pain in limb secondary to onychomycosis.  Plan: Debridement of nails 1 through 5 bilateral is cover service secondary to pain

## 2013-08-13 ENCOUNTER — Ambulatory Visit: Payer: Managed Care, Other (non HMO) | Admitting: Podiatry

## 2013-09-10 ENCOUNTER — Ambulatory Visit (INDEPENDENT_AMBULATORY_CARE_PROVIDER_SITE_OTHER): Payer: Managed Care, Other (non HMO) | Admitting: Podiatry

## 2013-09-10 ENCOUNTER — Ambulatory Visit: Payer: Managed Care, Other (non HMO) | Admitting: Podiatry

## 2013-09-10 ENCOUNTER — Encounter: Payer: Self-pay | Admitting: Podiatry

## 2013-09-10 VITALS — BP 122/68 | HR 94 | Resp 17 | Ht 73.0 in | Wt 261.0 lb

## 2013-09-10 DIAGNOSIS — E119 Type 2 diabetes mellitus without complications: Secondary | ICD-10-CM

## 2013-09-10 DIAGNOSIS — B351 Tinea unguium: Secondary | ICD-10-CM

## 2013-09-10 DIAGNOSIS — M79609 Pain in unspecified limb: Secondary | ICD-10-CM

## 2013-09-11 NOTE — Progress Notes (Signed)
He presents today with a chief complaint of painful elongated toenails one through 5 bilateral.  Objective: Vital signs are stable he is alert and oriented x3. There is no erythema edema saline is drainage or odor nails are thick yellow dystrophic with mycotic and painful palpation as well as debridement.  Assessment: Pain in limb secondary to onychomycosis 1 through 5 bilateral.  Plan: Debridement of nails 1 through 5 bilateral.

## 2013-12-10 ENCOUNTER — Encounter: Payer: Self-pay | Admitting: Podiatry

## 2013-12-10 ENCOUNTER — Ambulatory Visit (INDEPENDENT_AMBULATORY_CARE_PROVIDER_SITE_OTHER): Payer: Managed Care, Other (non HMO) | Admitting: Podiatry

## 2013-12-10 DIAGNOSIS — M79609 Pain in unspecified limb: Secondary | ICD-10-CM

## 2013-12-10 DIAGNOSIS — M79676 Pain in unspecified toe(s): Secondary | ICD-10-CM

## 2013-12-10 DIAGNOSIS — B351 Tinea unguium: Secondary | ICD-10-CM

## 2013-12-11 NOTE — Progress Notes (Signed)
He presents today chief complaint of painful elongated nails 1 through 5 bilateral.  Objective: Nails are thick yellow dystrophic with mycotic bilateral.  Assessment: Pain in limb secondary to onychomycosis.  Plan: Debridement of nails 1 through 5 bilateral covered service secondary to pain.

## 2014-03-27 ENCOUNTER — Ambulatory Visit: Payer: Managed Care, Other (non HMO) | Admitting: Podiatry

## 2014-03-27 ENCOUNTER — Ambulatory Visit (INDEPENDENT_AMBULATORY_CARE_PROVIDER_SITE_OTHER): Payer: Medicare HMO | Admitting: Podiatry

## 2014-03-27 ENCOUNTER — Encounter: Payer: Self-pay | Admitting: Podiatry

## 2014-03-27 DIAGNOSIS — B351 Tinea unguium: Secondary | ICD-10-CM

## 2014-03-27 DIAGNOSIS — M79676 Pain in unspecified toe(s): Secondary | ICD-10-CM

## 2014-03-27 NOTE — Progress Notes (Signed)
He presents today with a chief complaint of painful elongated toenails.  Objective: Pulses are palpable bilateral. Nails are thick yellow dystrophic with mycotic and painful palpation.  Assessment: Limb secondary to onychomycosis 1 through 5 bilateral.  Plan: Debridement of nails 1 through 5 bilateral covered service secondary to pain.

## 2014-06-26 ENCOUNTER — Ambulatory Visit (INDEPENDENT_AMBULATORY_CARE_PROVIDER_SITE_OTHER): Payer: Medicare HMO | Admitting: Podiatry

## 2014-06-26 DIAGNOSIS — M79673 Pain in unspecified foot: Secondary | ICD-10-CM

## 2014-06-26 DIAGNOSIS — B351 Tinea unguium: Secondary | ICD-10-CM

## 2014-06-26 NOTE — Progress Notes (Signed)
Subjective:     Patient ID: Fred Alvarez, male   DOB: June 29, 1940, 74 y.o.   MRN: [id number]  HPI patient presents with nail disease 1-5 both feet that are thick and he is unable to take care of himself   Review of Systems     Objective:   Physical Exam Neurovascular status unchanged with thick yellow brittle nailbeds 1-5 both feet that are painful when palpated dorsally    Assessment:     Chronic mycotic and painful nail infections 1-5 both feet    Plan:     Debridement nailbeds 1-5 both feet with no iatrogenic bleeding noted

## 2014-10-02 ENCOUNTER — Ambulatory Visit: Payer: Medicare HMO

## 2014-10-16 ENCOUNTER — Ambulatory Visit (INDEPENDENT_AMBULATORY_CARE_PROVIDER_SITE_OTHER): Payer: Medicare HMO | Admitting: Podiatry

## 2014-10-16 ENCOUNTER — Encounter: Payer: Self-pay | Admitting: Podiatry

## 2014-10-16 DIAGNOSIS — M79673 Pain in unspecified foot: Secondary | ICD-10-CM | POA: Diagnosis not present

## 2014-10-16 DIAGNOSIS — B351 Tinea unguium: Secondary | ICD-10-CM | POA: Diagnosis not present

## 2014-10-16 NOTE — Progress Notes (Signed)
Patient presents to the office today with a chief complaint of painful elongated toenails.  Objective: Pulses are palpable bilateral. Nails are thick yellow dystrophic clinically mycotic and painful palpation.  Assessment: Pain in limb secondary to onychomycosis 1 through 5 bilateral.  Plan: Debridement of nails 1 through 5 bilateral covered service secondary to pain.  Discuss orthotics next visit.

## 2015-01-22 ENCOUNTER — Encounter: Payer: Self-pay | Admitting: Podiatry

## 2015-01-22 ENCOUNTER — Ambulatory Visit (INDEPENDENT_AMBULATORY_CARE_PROVIDER_SITE_OTHER): Payer: Medicare HMO | Admitting: Podiatry

## 2015-01-22 DIAGNOSIS — B351 Tinea unguium: Secondary | ICD-10-CM | POA: Diagnosis not present

## 2015-01-22 DIAGNOSIS — Q828 Other specified congenital malformations of skin: Secondary | ICD-10-CM

## 2015-01-22 DIAGNOSIS — E119 Type 2 diabetes mellitus without complications: Secondary | ICD-10-CM

## 2015-01-22 DIAGNOSIS — M79673 Pain in unspecified foot: Secondary | ICD-10-CM

## 2015-01-22 DIAGNOSIS — M79609 Pain in unspecified limb: Secondary | ICD-10-CM

## 2015-01-23 NOTE — Progress Notes (Signed)
He presents today with a chief complaint of painful elongated toenails and thick calluses bilaterally. He states that these her with ambulation and shoe gear.  Objective: vital signs are stable he is alert and oriented 3 pulses are strongly palpable bilateral. Neurologic sensorium is intact. He has pain on palpation of his thick yellow dystrophic onychomycotic nails with poor keratomas to the plantar medial aspect of the bilateral foot.  Assessment: porokeratosis and pain in limb secondary to onychomycosis.  Plan: debridement of nails 1 through 5 bilateral covered service secondary to pain and debridement all reactive hyperkeratoses. Follow-up with him in 2-3 months.

## 2015-04-30 ENCOUNTER — Ambulatory Visit: Payer: Medicare HMO | Admitting: Podiatry

## 2015-05-05 ENCOUNTER — Ambulatory Visit (INDEPENDENT_AMBULATORY_CARE_PROVIDER_SITE_OTHER): Payer: Medicare HMO | Admitting: Podiatry

## 2015-05-05 ENCOUNTER — Encounter: Payer: Self-pay | Admitting: Podiatry

## 2015-05-05 DIAGNOSIS — M79676 Pain in unspecified toe(s): Secondary | ICD-10-CM

## 2015-05-05 DIAGNOSIS — M79609 Pain in unspecified limb: Principal | ICD-10-CM

## 2015-05-05 DIAGNOSIS — Q828 Other specified congenital malformations of skin: Secondary | ICD-10-CM | POA: Diagnosis not present

## 2015-05-05 DIAGNOSIS — B351 Tinea unguium: Secondary | ICD-10-CM

## 2015-05-05 DIAGNOSIS — E119 Type 2 diabetes mellitus without complications: Secondary | ICD-10-CM

## 2015-05-05 DIAGNOSIS — M779 Enthesopathy, unspecified: Secondary | ICD-10-CM

## 2015-05-05 DIAGNOSIS — M7752 Other enthesopathy of left foot: Secondary | ICD-10-CM | POA: Diagnosis not present

## 2015-05-05 DIAGNOSIS — M778 Other enthesopathies, not elsewhere classified: Secondary | ICD-10-CM

## 2015-05-05 NOTE — Progress Notes (Signed)
He presents today with a chief complaint painful elongated toenails and calluses bilateral.he is also complaining of pain overlying the sinus tarsi area of the left foot. He states this pain is intermittent but has worsened over the past few months.  Objective: Vital signs stable alert and oriented 3. Pulses are palpable. As thick yellow dystrophic onychomycotic nails bilateral multiple areas of porokeratosis. He also has pes planus with pain on end range of motion of the subtalar joint left foot.  Assessment: Capsulitis subtalar joint capsulitis left foot. Onychomycosis with pain in limb and porokeratosis.  Plan: Debridement of all reactive hyperkeratoses and toenails bilateral. After sterile Betadine skin prep injected 20 mg of Kenalog and local anesthetic into the sinus tarsi and subtalar joint left foot. Follow-up with him in 3 months for routine nail debridement. We'll follow up with me earlier if needed for the subtalar joint.

## 2015-08-04 ENCOUNTER — Encounter: Payer: Self-pay | Admitting: Podiatry

## 2015-08-04 ENCOUNTER — Ambulatory Visit (INDEPENDENT_AMBULATORY_CARE_PROVIDER_SITE_OTHER): Payer: Medicare HMO | Admitting: Podiatry

## 2015-08-04 DIAGNOSIS — M79673 Pain in unspecified foot: Secondary | ICD-10-CM

## 2015-08-04 DIAGNOSIS — E1142 Type 2 diabetes mellitus with diabetic polyneuropathy: Secondary | ICD-10-CM

## 2015-08-04 DIAGNOSIS — E119 Type 2 diabetes mellitus without complications: Secondary | ICD-10-CM

## 2015-08-04 DIAGNOSIS — B351 Tinea unguium: Secondary | ICD-10-CM | POA: Diagnosis not present

## 2015-08-04 DIAGNOSIS — M79609 Pain in unspecified limb: Principal | ICD-10-CM

## 2015-08-04 DIAGNOSIS — Q828 Other specified congenital malformations of skin: Secondary | ICD-10-CM

## 2015-08-05 NOTE — Progress Notes (Signed)
He presents today with chief complaint of painful elongated toenails and painful calluses. He states that he has some numbness and tingling in his toes.  Objective: Vital signs are stable alert and oriented 3. Pulses are palpable. Minimal loss of sensation to the bilateral foot persons once the monofilament. Toenails are thick yellow dystrophic onychomycotic painful palpation. Or keratoses are also noted bilateral.  Assessment: Pain in limb secondary to diabetic peripheral neuropathy and painful corns and calluses as well as mycotic nails.  Plan: Debridement of toenails 1 through 5 bilateral covered service secondary to pain and diabetes.

## 2015-11-03 ENCOUNTER — Ambulatory Visit (INDEPENDENT_AMBULATORY_CARE_PROVIDER_SITE_OTHER): Payer: Medicare HMO | Admitting: Podiatry

## 2015-11-03 ENCOUNTER — Encounter: Payer: Self-pay | Admitting: Podiatry

## 2015-11-03 DIAGNOSIS — E1142 Type 2 diabetes mellitus with diabetic polyneuropathy: Secondary | ICD-10-CM

## 2015-11-03 DIAGNOSIS — Q828 Other specified congenital malformations of skin: Secondary | ICD-10-CM

## 2015-11-03 DIAGNOSIS — M79676 Pain in unspecified toe(s): Secondary | ICD-10-CM | POA: Diagnosis not present

## 2015-11-03 DIAGNOSIS — Z7901 Long term (current) use of anticoagulants: Secondary | ICD-10-CM | POA: Diagnosis not present

## 2015-11-03 DIAGNOSIS — B351 Tinea unguium: Secondary | ICD-10-CM | POA: Diagnosis not present

## 2015-11-03 DIAGNOSIS — M79609 Pain in unspecified limb: Principal | ICD-10-CM

## 2015-11-03 DIAGNOSIS — I2782 Chronic pulmonary embolism: Secondary | ICD-10-CM | POA: Diagnosis not present

## 2015-11-03 NOTE — Progress Notes (Signed)
He presents today with a history of diabetes and diabetic peripheral neuropathy for chief complaint of painful elongated toenails bilaterally.  Objective: Vital signs are stable he is alert and oriented 3 pulses remain palpable bilateral. Neurologic sensorium is diminished bilateral. Multiple porokeratotic lesions noted to the medial and plantar aspect of the bilateral foot with thick yellow dystrophic onychomycotic nails. No open lesions or wounds.  Assessment: Diabetes with diabetic peripheral neuropathy porokeratosis and pain in limb secondary to onychomycosis.  Plan: Debridement of all reactive hyperkeratosis. Debridement of nails 1 through 5 bilateral covered service secondary to pain. And I will follow-up with him in 3 months.

## 2015-12-10 DIAGNOSIS — Z7901 Long term (current) use of anticoagulants: Secondary | ICD-10-CM | POA: Diagnosis not present

## 2015-12-10 DIAGNOSIS — M0589 Other rheumatoid arthritis with rheumatoid factor of multiple sites: Secondary | ICD-10-CM | POA: Diagnosis not present

## 2015-12-10 DIAGNOSIS — M1A09X0 Idiopathic chronic gout, multiple sites, without tophus (tophi): Secondary | ICD-10-CM | POA: Diagnosis not present

## 2015-12-10 DIAGNOSIS — M7582 Other shoulder lesions, left shoulder: Secondary | ICD-10-CM | POA: Diagnosis not present

## 2015-12-10 DIAGNOSIS — N183 Chronic kidney disease, stage 3 (moderate): Secondary | ICD-10-CM | POA: Diagnosis not present

## 2015-12-10 DIAGNOSIS — I2782 Chronic pulmonary embolism: Secondary | ICD-10-CM | POA: Diagnosis not present

## 2016-01-13 DIAGNOSIS — I2782 Chronic pulmonary embolism: Secondary | ICD-10-CM | POA: Diagnosis not present

## 2016-01-13 DIAGNOSIS — Z23 Encounter for immunization: Secondary | ICD-10-CM | POA: Diagnosis not present

## 2016-01-13 DIAGNOSIS — Z7901 Long term (current) use of anticoagulants: Secondary | ICD-10-CM | POA: Diagnosis not present

## 2016-02-02 ENCOUNTER — Ambulatory Visit (INDEPENDENT_AMBULATORY_CARE_PROVIDER_SITE_OTHER): Payer: Medicare HMO | Admitting: Podiatry

## 2016-02-02 ENCOUNTER — Encounter: Payer: Self-pay | Admitting: Podiatry

## 2016-02-02 DIAGNOSIS — Q828 Other specified congenital malformations of skin: Secondary | ICD-10-CM

## 2016-02-02 DIAGNOSIS — B351 Tinea unguium: Secondary | ICD-10-CM | POA: Diagnosis not present

## 2016-02-02 DIAGNOSIS — E1142 Type 2 diabetes mellitus with diabetic polyneuropathy: Secondary | ICD-10-CM | POA: Diagnosis not present

## 2016-02-02 DIAGNOSIS — M79609 Pain in unspecified limb: Principal | ICD-10-CM

## 2016-02-02 NOTE — Progress Notes (Signed)
He presents today chief complaint of painful elongated toenails 1 through 5 bilateral.  Objective: Toenails are thick yellow dystrophic with mycotic painful palpation. He has multiple keratotic lesions plantar aspect of the foot pulses remain palpable no open lesions.  Assessment: Pain and limp secondary to onychomycosis and hyperkeratosis.  Plan: Debridement of toenails 1 through 5 bilateral.

## 2016-03-11 DIAGNOSIS — M0589 Other rheumatoid arthritis with rheumatoid factor of multiple sites: Secondary | ICD-10-CM | POA: Diagnosis not present

## 2016-03-26 DIAGNOSIS — M25512 Pain in left shoulder: Secondary | ICD-10-CM | POA: Diagnosis not present

## 2016-03-26 DIAGNOSIS — Z23 Encounter for immunization: Secondary | ICD-10-CM | POA: Diagnosis not present

## 2016-03-26 DIAGNOSIS — M19072 Primary osteoarthritis, left ankle and foot: Secondary | ICD-10-CM | POA: Diagnosis not present

## 2016-03-26 DIAGNOSIS — Z820 Family history of epilepsy and other diseases of the nervous system: Secondary | ICD-10-CM | POA: Diagnosis not present

## 2016-03-26 DIAGNOSIS — M069 Rheumatoid arthritis, unspecified: Secondary | ICD-10-CM | POA: Diagnosis not present

## 2016-03-26 DIAGNOSIS — Z8711 Personal history of peptic ulcer disease: Secondary | ICD-10-CM | POA: Diagnosis not present

## 2016-03-26 DIAGNOSIS — Z794 Long term (current) use of insulin: Secondary | ICD-10-CM | POA: Diagnosis not present

## 2016-03-26 DIAGNOSIS — Z79899 Other long term (current) drug therapy: Secondary | ICD-10-CM | POA: Diagnosis not present

## 2016-03-26 DIAGNOSIS — Z9181 History of falling: Secondary | ICD-10-CM | POA: Diagnosis not present

## 2016-03-26 DIAGNOSIS — Z7901 Long term (current) use of anticoagulants: Secondary | ICD-10-CM | POA: Diagnosis not present

## 2016-03-26 DIAGNOSIS — S41012A Laceration without foreign body of left shoulder, initial encounter: Secondary | ICD-10-CM | POA: Diagnosis not present

## 2016-03-26 DIAGNOSIS — Z86718 Personal history of other venous thrombosis and embolism: Secondary | ICD-10-CM | POA: Diagnosis not present

## 2016-03-26 DIAGNOSIS — D869 Sarcoidosis, unspecified: Secondary | ICD-10-CM | POA: Diagnosis not present

## 2016-03-26 DIAGNOSIS — Z823 Family history of stroke: Secondary | ICD-10-CM | POA: Diagnosis not present

## 2016-03-26 DIAGNOSIS — S42212A Unspecified displaced fracture of surgical neck of left humerus, initial encounter for closed fracture: Secondary | ICD-10-CM | POA: Diagnosis not present

## 2016-03-26 DIAGNOSIS — S42292A Other displaced fracture of upper end of left humerus, initial encounter for closed fracture: Secondary | ICD-10-CM | POA: Diagnosis not present

## 2016-03-26 DIAGNOSIS — E785 Hyperlipidemia, unspecified: Secondary | ICD-10-CM | POA: Diagnosis not present

## 2016-03-26 DIAGNOSIS — M25521 Pain in right elbow: Secondary | ICD-10-CM | POA: Diagnosis not present

## 2016-03-27 DIAGNOSIS — W1809XA Striking against other object with subsequent fall, initial encounter: Secondary | ICD-10-CM | POA: Diagnosis not present

## 2016-03-27 DIAGNOSIS — M069 Rheumatoid arthritis, unspecified: Secondary | ICD-10-CM | POA: Diagnosis not present

## 2016-03-27 DIAGNOSIS — E784 Other hyperlipidemia: Secondary | ICD-10-CM | POA: Diagnosis not present

## 2016-03-27 DIAGNOSIS — Z9181 History of falling: Secondary | ICD-10-CM | POA: Diagnosis not present

## 2016-03-27 DIAGNOSIS — I10 Essential (primary) hypertension: Secondary | ICD-10-CM | POA: Diagnosis not present

## 2016-03-27 DIAGNOSIS — S4992XA Unspecified injury of left shoulder and upper arm, initial encounter: Secondary | ICD-10-CM | POA: Diagnosis not present

## 2016-03-27 DIAGNOSIS — S42212A Unspecified displaced fracture of surgical neck of left humerus, initial encounter for closed fracture: Secondary | ICD-10-CM | POA: Diagnosis not present

## 2016-03-27 DIAGNOSIS — N189 Chronic kidney disease, unspecified: Secondary | ICD-10-CM | POA: Diagnosis not present

## 2016-03-27 DIAGNOSIS — S42202D Unspecified fracture of upper end of left humerus, subsequent encounter for fracture with routine healing: Secondary | ICD-10-CM | POA: Diagnosis not present

## 2016-03-27 DIAGNOSIS — E559 Vitamin D deficiency, unspecified: Secondary | ICD-10-CM | POA: Diagnosis not present

## 2016-03-27 DIAGNOSIS — M6281 Muscle weakness (generalized): Secondary | ICD-10-CM | POA: Diagnosis not present

## 2016-03-27 DIAGNOSIS — S42292A Other displaced fracture of upper end of left humerus, initial encounter for closed fracture: Secondary | ICD-10-CM | POA: Diagnosis not present

## 2016-03-27 DIAGNOSIS — M19072 Primary osteoarthritis, left ankle and foot: Secondary | ICD-10-CM | POA: Diagnosis not present

## 2016-03-27 DIAGNOSIS — E039 Hypothyroidism, unspecified: Secondary | ICD-10-CM | POA: Diagnosis present

## 2016-03-27 DIAGNOSIS — Y900 Blood alcohol level of less than 20 mg/100 ml: Secondary | ICD-10-CM | POA: Diagnosis not present

## 2016-03-27 DIAGNOSIS — K219 Gastro-esophageal reflux disease without esophagitis: Secondary | ICD-10-CM | POA: Diagnosis present

## 2016-03-27 DIAGNOSIS — R2689 Other abnormalities of gait and mobility: Secondary | ICD-10-CM | POA: Diagnosis not present

## 2016-03-27 DIAGNOSIS — S42002D Fracture of unspecified part of left clavicle, subsequent encounter for fracture with routine healing: Secondary | ICD-10-CM | POA: Diagnosis not present

## 2016-03-27 DIAGNOSIS — E038 Other specified hypothyroidism: Secondary | ICD-10-CM | POA: Diagnosis not present

## 2016-03-27 DIAGNOSIS — D869 Sarcoidosis, unspecified: Secondary | ICD-10-CM | POA: Diagnosis present

## 2016-03-27 DIAGNOSIS — Z6836 Body mass index (BMI) 36.0-36.9, adult: Secondary | ICD-10-CM | POA: Diagnosis not present

## 2016-03-27 DIAGNOSIS — R600 Localized edema: Secondary | ICD-10-CM | POA: Diagnosis not present

## 2016-03-27 DIAGNOSIS — I130 Hypertensive heart and chronic kidney disease with heart failure and stage 1 through stage 4 chronic kidney disease, or unspecified chronic kidney disease: Secondary | ICD-10-CM | POA: Diagnosis not present

## 2016-03-27 DIAGNOSIS — G4733 Obstructive sleep apnea (adult) (pediatric): Secondary | ICD-10-CM | POA: Diagnosis not present

## 2016-03-27 DIAGNOSIS — M2142 Flat foot [pes planus] (acquired), left foot: Secondary | ICD-10-CM | POA: Diagnosis present

## 2016-03-27 DIAGNOSIS — E119 Type 2 diabetes mellitus without complications: Secondary | ICD-10-CM | POA: Diagnosis not present

## 2016-03-27 DIAGNOSIS — Z5189 Encounter for other specified aftercare: Secondary | ICD-10-CM | POA: Diagnosis not present

## 2016-03-27 DIAGNOSIS — M109 Gout, unspecified: Secondary | ICD-10-CM | POA: Diagnosis present

## 2016-03-27 DIAGNOSIS — G40909 Epilepsy, unspecified, not intractable, without status epilepticus: Secondary | ICD-10-CM | POA: Diagnosis present

## 2016-03-27 DIAGNOSIS — Z86718 Personal history of other venous thrombosis and embolism: Secondary | ICD-10-CM | POA: Diagnosis not present

## 2016-03-27 DIAGNOSIS — E785 Hyperlipidemia, unspecified: Secondary | ICD-10-CM | POA: Diagnosis not present

## 2016-03-27 DIAGNOSIS — R278 Other lack of coordination: Secondary | ICD-10-CM | POA: Diagnosis not present

## 2016-03-27 DIAGNOSIS — I509 Heart failure, unspecified: Secondary | ICD-10-CM | POA: Diagnosis not present

## 2016-03-31 DIAGNOSIS — R2689 Other abnormalities of gait and mobility: Secondary | ICD-10-CM | POA: Diagnosis not present

## 2016-03-31 DIAGNOSIS — Z5189 Encounter for other specified aftercare: Secondary | ICD-10-CM | POA: Diagnosis not present

## 2016-03-31 DIAGNOSIS — E784 Other hyperlipidemia: Secondary | ICD-10-CM | POA: Diagnosis not present

## 2016-03-31 DIAGNOSIS — Y900 Blood alcohol level of less than 20 mg/100 ml: Secondary | ICD-10-CM | POA: Diagnosis not present

## 2016-03-31 DIAGNOSIS — M25512 Pain in left shoulder: Secondary | ICD-10-CM | POA: Diagnosis not present

## 2016-03-31 DIAGNOSIS — S42212A Unspecified displaced fracture of surgical neck of left humerus, initial encounter for closed fracture: Secondary | ICD-10-CM | POA: Diagnosis not present

## 2016-03-31 DIAGNOSIS — I130 Hypertensive heart and chronic kidney disease with heart failure and stage 1 through stage 4 chronic kidney disease, or unspecified chronic kidney disease: Secondary | ICD-10-CM | POA: Diagnosis not present

## 2016-03-31 DIAGNOSIS — M069 Rheumatoid arthritis, unspecified: Secondary | ICD-10-CM | POA: Diagnosis not present

## 2016-03-31 DIAGNOSIS — W1809XA Striking against other object with subsequent fall, initial encounter: Secondary | ICD-10-CM | POA: Diagnosis not present

## 2016-03-31 DIAGNOSIS — I10 Essential (primary) hypertension: Secondary | ICD-10-CM | POA: Diagnosis not present

## 2016-03-31 DIAGNOSIS — M19072 Primary osteoarthritis, left ankle and foot: Secondary | ICD-10-CM | POA: Diagnosis not present

## 2016-03-31 DIAGNOSIS — E559 Vitamin D deficiency, unspecified: Secondary | ICD-10-CM | POA: Diagnosis not present

## 2016-03-31 DIAGNOSIS — N189 Chronic kidney disease, unspecified: Secondary | ICD-10-CM | POA: Diagnosis not present

## 2016-03-31 DIAGNOSIS — K219 Gastro-esophageal reflux disease without esophagitis: Secondary | ICD-10-CM | POA: Diagnosis not present

## 2016-03-31 DIAGNOSIS — R278 Other lack of coordination: Secondary | ICD-10-CM | POA: Diagnosis not present

## 2016-03-31 DIAGNOSIS — I509 Heart failure, unspecified: Secondary | ICD-10-CM | POA: Diagnosis not present

## 2016-03-31 DIAGNOSIS — M1990 Unspecified osteoarthritis, unspecified site: Secondary | ICD-10-CM | POA: Diagnosis not present

## 2016-03-31 DIAGNOSIS — S42002D Fracture of unspecified part of left clavicle, subsequent encounter for fracture with routine healing: Secondary | ICD-10-CM | POA: Diagnosis not present

## 2016-03-31 DIAGNOSIS — R600 Localized edema: Secondary | ICD-10-CM | POA: Diagnosis not present

## 2016-03-31 DIAGNOSIS — E785 Hyperlipidemia, unspecified: Secondary | ICD-10-CM | POA: Diagnosis not present

## 2016-03-31 DIAGNOSIS — M6281 Muscle weakness (generalized): Secondary | ICD-10-CM | POA: Diagnosis not present

## 2016-03-31 DIAGNOSIS — E038 Other specified hypothyroidism: Secondary | ICD-10-CM | POA: Diagnosis not present

## 2016-03-31 DIAGNOSIS — Z86718 Personal history of other venous thrombosis and embolism: Secondary | ICD-10-CM | POA: Diagnosis not present

## 2016-03-31 DIAGNOSIS — E119 Type 2 diabetes mellitus without complications: Secondary | ICD-10-CM | POA: Diagnosis not present

## 2016-03-31 DIAGNOSIS — S4292XD Fracture of left shoulder girdle, part unspecified, subsequent encounter for fracture with routine healing: Secondary | ICD-10-CM | POA: Diagnosis not present

## 2016-03-31 DIAGNOSIS — G4733 Obstructive sleep apnea (adult) (pediatric): Secondary | ICD-10-CM | POA: Diagnosis not present

## 2016-03-31 DIAGNOSIS — S42202D Unspecified fracture of upper end of left humerus, subsequent encounter for fracture with routine healing: Secondary | ICD-10-CM | POA: Diagnosis not present

## 2016-03-31 DIAGNOSIS — M109 Gout, unspecified: Secondary | ICD-10-CM | POA: Diagnosis not present

## 2016-04-22 DIAGNOSIS — S4292XD Fracture of left shoulder girdle, part unspecified, subsequent encounter for fracture with routine healing: Secondary | ICD-10-CM | POA: Diagnosis not present

## 2016-05-03 ENCOUNTER — Ambulatory Visit: Payer: Medicare HMO | Admitting: Podiatry

## 2016-05-16 DIAGNOSIS — M1990 Unspecified osteoarthritis, unspecified site: Secondary | ICD-10-CM | POA: Diagnosis not present

## 2016-05-16 DIAGNOSIS — K219 Gastro-esophageal reflux disease without esophagitis: Secondary | ICD-10-CM | POA: Diagnosis not present

## 2016-05-16 DIAGNOSIS — I10 Essential (primary) hypertension: Secondary | ICD-10-CM | POA: Diagnosis not present

## 2016-05-16 DIAGNOSIS — N189 Chronic kidney disease, unspecified: Secondary | ICD-10-CM | POA: Diagnosis not present

## 2016-05-16 DIAGNOSIS — M25512 Pain in left shoulder: Secondary | ICD-10-CM | POA: Diagnosis not present

## 2016-05-25 DIAGNOSIS — M6281 Muscle weakness (generalized): Secondary | ICD-10-CM | POA: Diagnosis not present

## 2016-05-25 DIAGNOSIS — M25512 Pain in left shoulder: Secondary | ICD-10-CM | POA: Diagnosis not present

## 2016-05-25 DIAGNOSIS — R2689 Other abnormalities of gait and mobility: Secondary | ICD-10-CM | POA: Diagnosis not present

## 2016-05-25 DIAGNOSIS — R296 Repeated falls: Secondary | ICD-10-CM | POA: Diagnosis not present

## 2016-05-25 DIAGNOSIS — S42202D Unspecified fracture of upper end of left humerus, subsequent encounter for fracture with routine healing: Secondary | ICD-10-CM | POA: Diagnosis not present

## 2016-05-25 DIAGNOSIS — M25612 Stiffness of left shoulder, not elsewhere classified: Secondary | ICD-10-CM | POA: Diagnosis not present

## 2016-05-27 DIAGNOSIS — M6281 Muscle weakness (generalized): Secondary | ICD-10-CM | POA: Diagnosis not present

## 2016-05-27 DIAGNOSIS — S42202D Unspecified fracture of upper end of left humerus, subsequent encounter for fracture with routine healing: Secondary | ICD-10-CM | POA: Diagnosis not present

## 2016-05-27 DIAGNOSIS — R2689 Other abnormalities of gait and mobility: Secondary | ICD-10-CM | POA: Diagnosis not present

## 2016-05-27 DIAGNOSIS — M25512 Pain in left shoulder: Secondary | ICD-10-CM | POA: Diagnosis not present

## 2016-05-27 DIAGNOSIS — R296 Repeated falls: Secondary | ICD-10-CM | POA: Diagnosis not present

## 2016-05-27 DIAGNOSIS — M25612 Stiffness of left shoulder, not elsewhere classified: Secondary | ICD-10-CM | POA: Diagnosis not present

## 2016-05-30 DIAGNOSIS — R296 Repeated falls: Secondary | ICD-10-CM | POA: Diagnosis not present

## 2016-05-30 DIAGNOSIS — S42202D Unspecified fracture of upper end of left humerus, subsequent encounter for fracture with routine healing: Secondary | ICD-10-CM | POA: Diagnosis not present

## 2016-05-30 DIAGNOSIS — R2689 Other abnormalities of gait and mobility: Secondary | ICD-10-CM | POA: Diagnosis not present

## 2016-05-30 DIAGNOSIS — M25512 Pain in left shoulder: Secondary | ICD-10-CM | POA: Diagnosis not present

## 2016-05-30 DIAGNOSIS — M25612 Stiffness of left shoulder, not elsewhere classified: Secondary | ICD-10-CM | POA: Diagnosis not present

## 2016-05-30 DIAGNOSIS — M6281 Muscle weakness (generalized): Secondary | ICD-10-CM | POA: Diagnosis not present

## 2016-06-01 DIAGNOSIS — I2782 Chronic pulmonary embolism: Secondary | ICD-10-CM | POA: Diagnosis not present

## 2016-06-01 DIAGNOSIS — S4292XD Fracture of left shoulder girdle, part unspecified, subsequent encounter for fracture with routine healing: Secondary | ICD-10-CM | POA: Diagnosis not present

## 2016-06-01 DIAGNOSIS — I10 Essential (primary) hypertension: Secondary | ICD-10-CM | POA: Diagnosis not present

## 2016-06-01 DIAGNOSIS — N183 Chronic kidney disease, stage 3 (moderate): Secondary | ICD-10-CM | POA: Diagnosis not present

## 2016-06-01 DIAGNOSIS — M6281 Muscle weakness (generalized): Secondary | ICD-10-CM | POA: Diagnosis not present

## 2016-06-01 DIAGNOSIS — Z7901 Long term (current) use of anticoagulants: Secondary | ICD-10-CM | POA: Diagnosis not present

## 2016-06-01 DIAGNOSIS — M25612 Stiffness of left shoulder, not elsewhere classified: Secondary | ICD-10-CM | POA: Diagnosis not present

## 2016-06-01 DIAGNOSIS — R2689 Other abnormalities of gait and mobility: Secondary | ICD-10-CM | POA: Diagnosis not present

## 2016-06-01 DIAGNOSIS — Z6836 Body mass index (BMI) 36.0-36.9, adult: Secondary | ICD-10-CM | POA: Diagnosis not present

## 2016-06-01 DIAGNOSIS — M25512 Pain in left shoulder: Secondary | ICD-10-CM | POA: Diagnosis not present

## 2016-06-01 DIAGNOSIS — R296 Repeated falls: Secondary | ICD-10-CM | POA: Diagnosis not present

## 2016-06-01 DIAGNOSIS — S42202D Unspecified fracture of upper end of left humerus, subsequent encounter for fracture with routine healing: Secondary | ICD-10-CM | POA: Diagnosis not present

## 2016-06-01 DIAGNOSIS — E1129 Type 2 diabetes mellitus with other diabetic kidney complication: Secondary | ICD-10-CM | POA: Diagnosis not present

## 2016-06-03 DIAGNOSIS — M25512 Pain in left shoulder: Secondary | ICD-10-CM | POA: Diagnosis not present

## 2016-06-03 DIAGNOSIS — S42202D Unspecified fracture of upper end of left humerus, subsequent encounter for fracture with routine healing: Secondary | ICD-10-CM | POA: Diagnosis not present

## 2016-06-03 DIAGNOSIS — M6281 Muscle weakness (generalized): Secondary | ICD-10-CM | POA: Diagnosis not present

## 2016-06-03 DIAGNOSIS — R2689 Other abnormalities of gait and mobility: Secondary | ICD-10-CM | POA: Diagnosis not present

## 2016-06-03 DIAGNOSIS — M25612 Stiffness of left shoulder, not elsewhere classified: Secondary | ICD-10-CM | POA: Diagnosis not present

## 2016-06-03 DIAGNOSIS — R296 Repeated falls: Secondary | ICD-10-CM | POA: Diagnosis not present

## 2016-06-06 DIAGNOSIS — R2689 Other abnormalities of gait and mobility: Secondary | ICD-10-CM | POA: Diagnosis not present

## 2016-06-06 DIAGNOSIS — S42202D Unspecified fracture of upper end of left humerus, subsequent encounter for fracture with routine healing: Secondary | ICD-10-CM | POA: Diagnosis not present

## 2016-06-06 DIAGNOSIS — M25612 Stiffness of left shoulder, not elsewhere classified: Secondary | ICD-10-CM | POA: Diagnosis not present

## 2016-06-06 DIAGNOSIS — R296 Repeated falls: Secondary | ICD-10-CM | POA: Diagnosis not present

## 2016-06-06 DIAGNOSIS — M25512 Pain in left shoulder: Secondary | ICD-10-CM | POA: Diagnosis not present

## 2016-06-06 DIAGNOSIS — M6281 Muscle weakness (generalized): Secondary | ICD-10-CM | POA: Diagnosis not present

## 2016-06-08 DIAGNOSIS — R296 Repeated falls: Secondary | ICD-10-CM | POA: Diagnosis not present

## 2016-06-08 DIAGNOSIS — R2689 Other abnormalities of gait and mobility: Secondary | ICD-10-CM | POA: Diagnosis not present

## 2016-06-08 DIAGNOSIS — M6281 Muscle weakness (generalized): Secondary | ICD-10-CM | POA: Diagnosis not present

## 2016-06-08 DIAGNOSIS — M25512 Pain in left shoulder: Secondary | ICD-10-CM | POA: Diagnosis not present

## 2016-06-08 DIAGNOSIS — S42202D Unspecified fracture of upper end of left humerus, subsequent encounter for fracture with routine healing: Secondary | ICD-10-CM | POA: Diagnosis not present

## 2016-06-08 DIAGNOSIS — M25612 Stiffness of left shoulder, not elsewhere classified: Secondary | ICD-10-CM | POA: Diagnosis not present

## 2016-06-10 DIAGNOSIS — M6281 Muscle weakness (generalized): Secondary | ICD-10-CM | POA: Diagnosis not present

## 2016-06-10 DIAGNOSIS — M25612 Stiffness of left shoulder, not elsewhere classified: Secondary | ICD-10-CM | POA: Diagnosis not present

## 2016-06-10 DIAGNOSIS — M25512 Pain in left shoulder: Secondary | ICD-10-CM | POA: Diagnosis not present

## 2016-06-10 DIAGNOSIS — R296 Repeated falls: Secondary | ICD-10-CM | POA: Diagnosis not present

## 2016-06-10 DIAGNOSIS — R2689 Other abnormalities of gait and mobility: Secondary | ICD-10-CM | POA: Diagnosis not present

## 2016-06-10 DIAGNOSIS — S42202D Unspecified fracture of upper end of left humerus, subsequent encounter for fracture with routine healing: Secondary | ICD-10-CM | POA: Diagnosis not present

## 2016-06-13 DIAGNOSIS — M25512 Pain in left shoulder: Secondary | ICD-10-CM | POA: Diagnosis not present

## 2016-06-13 DIAGNOSIS — R2689 Other abnormalities of gait and mobility: Secondary | ICD-10-CM | POA: Diagnosis not present

## 2016-06-13 DIAGNOSIS — S42202D Unspecified fracture of upper end of left humerus, subsequent encounter for fracture with routine healing: Secondary | ICD-10-CM | POA: Diagnosis not present

## 2016-06-13 DIAGNOSIS — M25612 Stiffness of left shoulder, not elsewhere classified: Secondary | ICD-10-CM | POA: Diagnosis not present

## 2016-06-13 DIAGNOSIS — R296 Repeated falls: Secondary | ICD-10-CM | POA: Diagnosis not present

## 2016-06-13 DIAGNOSIS — M6281 Muscle weakness (generalized): Secondary | ICD-10-CM | POA: Diagnosis not present

## 2016-06-17 DIAGNOSIS — M6281 Muscle weakness (generalized): Secondary | ICD-10-CM | POA: Diagnosis not present

## 2016-06-17 DIAGNOSIS — R2689 Other abnormalities of gait and mobility: Secondary | ICD-10-CM | POA: Diagnosis not present

## 2016-06-17 DIAGNOSIS — M25512 Pain in left shoulder: Secondary | ICD-10-CM | POA: Diagnosis not present

## 2016-06-17 DIAGNOSIS — R296 Repeated falls: Secondary | ICD-10-CM | POA: Diagnosis not present

## 2016-06-17 DIAGNOSIS — S42202D Unspecified fracture of upper end of left humerus, subsequent encounter for fracture with routine healing: Secondary | ICD-10-CM | POA: Diagnosis not present

## 2016-06-17 DIAGNOSIS — M25612 Stiffness of left shoulder, not elsewhere classified: Secondary | ICD-10-CM | POA: Diagnosis not present

## 2016-06-20 DIAGNOSIS — Z794 Long term (current) use of insulin: Secondary | ICD-10-CM | POA: Diagnosis not present

## 2016-06-20 DIAGNOSIS — E119 Type 2 diabetes mellitus without complications: Secondary | ICD-10-CM | POA: Diagnosis not present

## 2016-06-20 DIAGNOSIS — M25512 Pain in left shoulder: Secondary | ICD-10-CM | POA: Diagnosis not present

## 2016-06-20 DIAGNOSIS — H25813 Combined forms of age-related cataract, bilateral: Secondary | ICD-10-CM | POA: Diagnosis not present

## 2016-06-20 DIAGNOSIS — M6281 Muscle weakness (generalized): Secondary | ICD-10-CM | POA: Diagnosis not present

## 2016-06-20 DIAGNOSIS — S42202D Unspecified fracture of upper end of left humerus, subsequent encounter for fracture with routine healing: Secondary | ICD-10-CM | POA: Diagnosis not present

## 2016-06-20 DIAGNOSIS — M25612 Stiffness of left shoulder, not elsewhere classified: Secondary | ICD-10-CM | POA: Diagnosis not present

## 2016-06-20 DIAGNOSIS — R2689 Other abnormalities of gait and mobility: Secondary | ICD-10-CM | POA: Diagnosis not present

## 2016-06-20 DIAGNOSIS — R296 Repeated falls: Secondary | ICD-10-CM | POA: Diagnosis not present

## 2016-06-21 ENCOUNTER — Encounter: Payer: Self-pay | Admitting: Podiatry

## 2016-06-21 ENCOUNTER — Ambulatory Visit (INDEPENDENT_AMBULATORY_CARE_PROVIDER_SITE_OTHER): Payer: Medicare Other | Admitting: Podiatry

## 2016-06-21 DIAGNOSIS — B351 Tinea unguium: Secondary | ICD-10-CM

## 2016-06-21 DIAGNOSIS — M79609 Pain in unspecified limb: Secondary | ICD-10-CM | POA: Diagnosis not present

## 2016-06-21 DIAGNOSIS — Q828 Other specified congenital malformations of skin: Secondary | ICD-10-CM

## 2016-06-22 DIAGNOSIS — M25512 Pain in left shoulder: Secondary | ICD-10-CM | POA: Diagnosis not present

## 2016-06-22 DIAGNOSIS — R2689 Other abnormalities of gait and mobility: Secondary | ICD-10-CM | POA: Diagnosis not present

## 2016-06-22 DIAGNOSIS — R296 Repeated falls: Secondary | ICD-10-CM | POA: Diagnosis not present

## 2016-06-22 DIAGNOSIS — S42202D Unspecified fracture of upper end of left humerus, subsequent encounter for fracture with routine healing: Secondary | ICD-10-CM | POA: Diagnosis not present

## 2016-06-22 DIAGNOSIS — M6281 Muscle weakness (generalized): Secondary | ICD-10-CM | POA: Diagnosis not present

## 2016-06-22 DIAGNOSIS — M25612 Stiffness of left shoulder, not elsewhere classified: Secondary | ICD-10-CM | POA: Diagnosis not present

## 2016-06-22 NOTE — Progress Notes (Signed)
He presents today for routine nail debridement. Complaining of painful elongated toenails and calluses to the tips of the toes.  Objective: Vital signs are stable he's alert and oriented 3 multiple porokeratotic lesions plantar aspect from the distal aspect of the toes. Pulses remain palpable. Toenails are thick yellow dystrophic onychomycotic.  Assessment: Pain in limb secondary to onychomycosis and porokeratosis.  Plan: Debridement of toenails and calluses.

## 2016-06-24 DIAGNOSIS — M25512 Pain in left shoulder: Secondary | ICD-10-CM | POA: Diagnosis not present

## 2016-06-24 DIAGNOSIS — S42202D Unspecified fracture of upper end of left humerus, subsequent encounter for fracture with routine healing: Secondary | ICD-10-CM | POA: Diagnosis not present

## 2016-06-24 DIAGNOSIS — R296 Repeated falls: Secondary | ICD-10-CM | POA: Diagnosis not present

## 2016-06-24 DIAGNOSIS — M25612 Stiffness of left shoulder, not elsewhere classified: Secondary | ICD-10-CM | POA: Diagnosis not present

## 2016-06-24 DIAGNOSIS — R2689 Other abnormalities of gait and mobility: Secondary | ICD-10-CM | POA: Diagnosis not present

## 2016-06-24 DIAGNOSIS — M6281 Muscle weakness (generalized): Secondary | ICD-10-CM | POA: Diagnosis not present

## 2016-06-27 DIAGNOSIS — R2689 Other abnormalities of gait and mobility: Secondary | ICD-10-CM | POA: Diagnosis not present

## 2016-06-27 DIAGNOSIS — S42202D Unspecified fracture of upper end of left humerus, subsequent encounter for fracture with routine healing: Secondary | ICD-10-CM | POA: Diagnosis not present

## 2016-06-27 DIAGNOSIS — M25512 Pain in left shoulder: Secondary | ICD-10-CM | POA: Diagnosis not present

## 2016-06-27 DIAGNOSIS — M25612 Stiffness of left shoulder, not elsewhere classified: Secondary | ICD-10-CM | POA: Diagnosis not present

## 2016-06-27 DIAGNOSIS — R296 Repeated falls: Secondary | ICD-10-CM | POA: Diagnosis not present

## 2016-06-27 DIAGNOSIS — M6281 Muscle weakness (generalized): Secondary | ICD-10-CM | POA: Diagnosis not present

## 2016-06-29 DIAGNOSIS — R296 Repeated falls: Secondary | ICD-10-CM | POA: Diagnosis not present

## 2016-06-29 DIAGNOSIS — M25512 Pain in left shoulder: Secondary | ICD-10-CM | POA: Diagnosis not present

## 2016-06-29 DIAGNOSIS — M6281 Muscle weakness (generalized): Secondary | ICD-10-CM | POA: Diagnosis not present

## 2016-06-29 DIAGNOSIS — M25612 Stiffness of left shoulder, not elsewhere classified: Secondary | ICD-10-CM | POA: Diagnosis not present

## 2016-06-29 DIAGNOSIS — R2689 Other abnormalities of gait and mobility: Secondary | ICD-10-CM | POA: Diagnosis not present

## 2016-06-29 DIAGNOSIS — S42202D Unspecified fracture of upper end of left humerus, subsequent encounter for fracture with routine healing: Secondary | ICD-10-CM | POA: Diagnosis not present

## 2016-07-01 DIAGNOSIS — M25512 Pain in left shoulder: Secondary | ICD-10-CM | POA: Diagnosis not present

## 2016-07-01 DIAGNOSIS — M6281 Muscle weakness (generalized): Secondary | ICD-10-CM | POA: Diagnosis not present

## 2016-07-01 DIAGNOSIS — R2689 Other abnormalities of gait and mobility: Secondary | ICD-10-CM | POA: Diagnosis not present

## 2016-07-01 DIAGNOSIS — R296 Repeated falls: Secondary | ICD-10-CM | POA: Diagnosis not present

## 2016-07-01 DIAGNOSIS — M25612 Stiffness of left shoulder, not elsewhere classified: Secondary | ICD-10-CM | POA: Diagnosis not present

## 2016-07-04 DIAGNOSIS — R2689 Other abnormalities of gait and mobility: Secondary | ICD-10-CM | POA: Diagnosis not present

## 2016-07-04 DIAGNOSIS — M6281 Muscle weakness (generalized): Secondary | ICD-10-CM | POA: Diagnosis not present

## 2016-07-04 DIAGNOSIS — R296 Repeated falls: Secondary | ICD-10-CM | POA: Diagnosis not present

## 2016-07-04 DIAGNOSIS — M25612 Stiffness of left shoulder, not elsewhere classified: Secondary | ICD-10-CM | POA: Diagnosis not present

## 2016-07-04 DIAGNOSIS — M25512 Pain in left shoulder: Secondary | ICD-10-CM | POA: Diagnosis not present

## 2016-07-06 DIAGNOSIS — M25612 Stiffness of left shoulder, not elsewhere classified: Secondary | ICD-10-CM | POA: Diagnosis not present

## 2016-07-06 DIAGNOSIS — R296 Repeated falls: Secondary | ICD-10-CM | POA: Diagnosis not present

## 2016-07-06 DIAGNOSIS — R2689 Other abnormalities of gait and mobility: Secondary | ICD-10-CM | POA: Diagnosis not present

## 2016-07-06 DIAGNOSIS — M25512 Pain in left shoulder: Secondary | ICD-10-CM | POA: Diagnosis not present

## 2016-07-06 DIAGNOSIS — M6281 Muscle weakness (generalized): Secondary | ICD-10-CM | POA: Diagnosis not present

## 2016-07-08 DIAGNOSIS — M25612 Stiffness of left shoulder, not elsewhere classified: Secondary | ICD-10-CM | POA: Diagnosis not present

## 2016-07-08 DIAGNOSIS — M6281 Muscle weakness (generalized): Secondary | ICD-10-CM | POA: Diagnosis not present

## 2016-07-08 DIAGNOSIS — M25512 Pain in left shoulder: Secondary | ICD-10-CM | POA: Diagnosis not present

## 2016-07-08 DIAGNOSIS — R2689 Other abnormalities of gait and mobility: Secondary | ICD-10-CM | POA: Diagnosis not present

## 2016-07-08 DIAGNOSIS — R296 Repeated falls: Secondary | ICD-10-CM | POA: Diagnosis not present

## 2016-07-11 DIAGNOSIS — R2689 Other abnormalities of gait and mobility: Secondary | ICD-10-CM | POA: Diagnosis not present

## 2016-07-11 DIAGNOSIS — R296 Repeated falls: Secondary | ICD-10-CM | POA: Diagnosis not present

## 2016-07-11 DIAGNOSIS — M6281 Muscle weakness (generalized): Secondary | ICD-10-CM | POA: Diagnosis not present

## 2016-07-11 DIAGNOSIS — M25512 Pain in left shoulder: Secondary | ICD-10-CM | POA: Diagnosis not present

## 2016-07-11 DIAGNOSIS — M25612 Stiffness of left shoulder, not elsewhere classified: Secondary | ICD-10-CM | POA: Diagnosis not present

## 2016-07-13 DIAGNOSIS — M6281 Muscle weakness (generalized): Secondary | ICD-10-CM | POA: Diagnosis not present

## 2016-07-13 DIAGNOSIS — M25512 Pain in left shoulder: Secondary | ICD-10-CM | POA: Diagnosis not present

## 2016-07-13 DIAGNOSIS — R296 Repeated falls: Secondary | ICD-10-CM | POA: Diagnosis not present

## 2016-07-13 DIAGNOSIS — M25612 Stiffness of left shoulder, not elsewhere classified: Secondary | ICD-10-CM | POA: Diagnosis not present

## 2016-07-13 DIAGNOSIS — R2689 Other abnormalities of gait and mobility: Secondary | ICD-10-CM | POA: Diagnosis not present

## 2016-07-15 DIAGNOSIS — R2689 Other abnormalities of gait and mobility: Secondary | ICD-10-CM | POA: Diagnosis not present

## 2016-07-15 DIAGNOSIS — M6281 Muscle weakness (generalized): Secondary | ICD-10-CM | POA: Diagnosis not present

## 2016-07-15 DIAGNOSIS — M25512 Pain in left shoulder: Secondary | ICD-10-CM | POA: Diagnosis not present

## 2016-07-15 DIAGNOSIS — R296 Repeated falls: Secondary | ICD-10-CM | POA: Diagnosis not present

## 2016-07-15 DIAGNOSIS — M25612 Stiffness of left shoulder, not elsewhere classified: Secondary | ICD-10-CM | POA: Diagnosis not present

## 2016-07-18 DIAGNOSIS — R296 Repeated falls: Secondary | ICD-10-CM | POA: Diagnosis not present

## 2016-07-18 DIAGNOSIS — M25512 Pain in left shoulder: Secondary | ICD-10-CM | POA: Diagnosis not present

## 2016-07-18 DIAGNOSIS — R2689 Other abnormalities of gait and mobility: Secondary | ICD-10-CM | POA: Diagnosis not present

## 2016-07-18 DIAGNOSIS — M25612 Stiffness of left shoulder, not elsewhere classified: Secondary | ICD-10-CM | POA: Diagnosis not present

## 2016-07-18 DIAGNOSIS — M6281 Muscle weakness (generalized): Secondary | ICD-10-CM | POA: Diagnosis not present

## 2016-07-19 DIAGNOSIS — E669 Obesity, unspecified: Secondary | ICD-10-CM | POA: Diagnosis not present

## 2016-07-19 DIAGNOSIS — M0589 Other rheumatoid arthritis with rheumatoid factor of multiple sites: Secondary | ICD-10-CM | POA: Diagnosis not present

## 2016-07-19 DIAGNOSIS — L814 Other melanin hyperpigmentation: Secondary | ICD-10-CM | POA: Diagnosis not present

## 2016-07-19 DIAGNOSIS — L821 Other seborrheic keratosis: Secondary | ICD-10-CM | POA: Diagnosis not present

## 2016-07-19 DIAGNOSIS — Z6835 Body mass index (BMI) 35.0-35.9, adult: Secondary | ICD-10-CM | POA: Diagnosis not present

## 2016-07-19 DIAGNOSIS — L570 Actinic keratosis: Secondary | ICD-10-CM | POA: Diagnosis not present

## 2016-07-19 DIAGNOSIS — Z79899 Other long term (current) drug therapy: Secondary | ICD-10-CM | POA: Diagnosis not present

## 2016-07-19 DIAGNOSIS — M1A09X0 Idiopathic chronic gout, multiple sites, without tophus (tophi): Secondary | ICD-10-CM | POA: Diagnosis not present

## 2016-07-19 DIAGNOSIS — N183 Chronic kidney disease, stage 3 (moderate): Secondary | ICD-10-CM | POA: Diagnosis not present

## 2016-07-19 DIAGNOSIS — M7582 Other shoulder lesions, left shoulder: Secondary | ICD-10-CM | POA: Diagnosis not present

## 2016-07-20 DIAGNOSIS — M25612 Stiffness of left shoulder, not elsewhere classified: Secondary | ICD-10-CM | POA: Diagnosis not present

## 2016-07-20 DIAGNOSIS — R2689 Other abnormalities of gait and mobility: Secondary | ICD-10-CM | POA: Diagnosis not present

## 2016-07-20 DIAGNOSIS — R296 Repeated falls: Secondary | ICD-10-CM | POA: Diagnosis not present

## 2016-07-20 DIAGNOSIS — M6281 Muscle weakness (generalized): Secondary | ICD-10-CM | POA: Diagnosis not present

## 2016-07-20 DIAGNOSIS — M25512 Pain in left shoulder: Secondary | ICD-10-CM | POA: Diagnosis not present

## 2016-07-22 DIAGNOSIS — M25612 Stiffness of left shoulder, not elsewhere classified: Secondary | ICD-10-CM | POA: Diagnosis not present

## 2016-07-22 DIAGNOSIS — M6281 Muscle weakness (generalized): Secondary | ICD-10-CM | POA: Diagnosis not present

## 2016-07-22 DIAGNOSIS — R2689 Other abnormalities of gait and mobility: Secondary | ICD-10-CM | POA: Diagnosis not present

## 2016-07-22 DIAGNOSIS — M25512 Pain in left shoulder: Secondary | ICD-10-CM | POA: Diagnosis not present

## 2016-07-22 DIAGNOSIS — R296 Repeated falls: Secondary | ICD-10-CM | POA: Diagnosis not present

## 2016-07-25 DIAGNOSIS — M6281 Muscle weakness (generalized): Secondary | ICD-10-CM | POA: Diagnosis not present

## 2016-07-25 DIAGNOSIS — R2689 Other abnormalities of gait and mobility: Secondary | ICD-10-CM | POA: Diagnosis not present

## 2016-07-25 DIAGNOSIS — R296 Repeated falls: Secondary | ICD-10-CM | POA: Diagnosis not present

## 2016-07-25 DIAGNOSIS — M25512 Pain in left shoulder: Secondary | ICD-10-CM | POA: Diagnosis not present

## 2016-07-25 DIAGNOSIS — M25612 Stiffness of left shoulder, not elsewhere classified: Secondary | ICD-10-CM | POA: Diagnosis not present

## 2016-07-27 DIAGNOSIS — M25612 Stiffness of left shoulder, not elsewhere classified: Secondary | ICD-10-CM | POA: Diagnosis not present

## 2016-07-27 DIAGNOSIS — R2689 Other abnormalities of gait and mobility: Secondary | ICD-10-CM | POA: Diagnosis not present

## 2016-07-27 DIAGNOSIS — R296 Repeated falls: Secondary | ICD-10-CM | POA: Diagnosis not present

## 2016-07-27 DIAGNOSIS — M25512 Pain in left shoulder: Secondary | ICD-10-CM | POA: Diagnosis not present

## 2016-07-27 DIAGNOSIS — M6281 Muscle weakness (generalized): Secondary | ICD-10-CM | POA: Diagnosis not present

## 2016-07-29 DIAGNOSIS — R296 Repeated falls: Secondary | ICD-10-CM | POA: Diagnosis not present

## 2016-07-29 DIAGNOSIS — M6281 Muscle weakness (generalized): Secondary | ICD-10-CM | POA: Diagnosis not present

## 2016-07-29 DIAGNOSIS — R2689 Other abnormalities of gait and mobility: Secondary | ICD-10-CM | POA: Diagnosis not present

## 2016-07-29 DIAGNOSIS — M25612 Stiffness of left shoulder, not elsewhere classified: Secondary | ICD-10-CM | POA: Diagnosis not present

## 2016-07-29 DIAGNOSIS — S42202D Unspecified fracture of upper end of left humerus, subsequent encounter for fracture with routine healing: Secondary | ICD-10-CM | POA: Diagnosis not present

## 2016-07-29 DIAGNOSIS — M25512 Pain in left shoulder: Secondary | ICD-10-CM | POA: Diagnosis not present

## 2016-08-01 DIAGNOSIS — M25512 Pain in left shoulder: Secondary | ICD-10-CM | POA: Diagnosis not present

## 2016-08-01 DIAGNOSIS — M6281 Muscle weakness (generalized): Secondary | ICD-10-CM | POA: Diagnosis not present

## 2016-08-01 DIAGNOSIS — R2689 Other abnormalities of gait and mobility: Secondary | ICD-10-CM | POA: Diagnosis not present

## 2016-08-01 DIAGNOSIS — M25612 Stiffness of left shoulder, not elsewhere classified: Secondary | ICD-10-CM | POA: Diagnosis not present

## 2016-08-01 DIAGNOSIS — R296 Repeated falls: Secondary | ICD-10-CM | POA: Diagnosis not present

## 2016-08-01 DIAGNOSIS — S42202D Unspecified fracture of upper end of left humerus, subsequent encounter for fracture with routine healing: Secondary | ICD-10-CM | POA: Diagnosis not present

## 2016-08-03 DIAGNOSIS — M25512 Pain in left shoulder: Secondary | ICD-10-CM | POA: Diagnosis not present

## 2016-08-03 DIAGNOSIS — R2689 Other abnormalities of gait and mobility: Secondary | ICD-10-CM | POA: Diagnosis not present

## 2016-08-03 DIAGNOSIS — R296 Repeated falls: Secondary | ICD-10-CM | POA: Diagnosis not present

## 2016-08-03 DIAGNOSIS — M25612 Stiffness of left shoulder, not elsewhere classified: Secondary | ICD-10-CM | POA: Diagnosis not present

## 2016-08-03 DIAGNOSIS — S42202D Unspecified fracture of upper end of left humerus, subsequent encounter for fracture with routine healing: Secondary | ICD-10-CM | POA: Diagnosis not present

## 2016-08-03 DIAGNOSIS — M6281 Muscle weakness (generalized): Secondary | ICD-10-CM | POA: Diagnosis not present

## 2016-08-10 DIAGNOSIS — R296 Repeated falls: Secondary | ICD-10-CM | POA: Diagnosis not present

## 2016-08-10 DIAGNOSIS — M25512 Pain in left shoulder: Secondary | ICD-10-CM | POA: Diagnosis not present

## 2016-08-10 DIAGNOSIS — S42202D Unspecified fracture of upper end of left humerus, subsequent encounter for fracture with routine healing: Secondary | ICD-10-CM | POA: Diagnosis not present

## 2016-08-10 DIAGNOSIS — M25612 Stiffness of left shoulder, not elsewhere classified: Secondary | ICD-10-CM | POA: Diagnosis not present

## 2016-08-10 DIAGNOSIS — M6281 Muscle weakness (generalized): Secondary | ICD-10-CM | POA: Diagnosis not present

## 2016-08-10 DIAGNOSIS — R2689 Other abnormalities of gait and mobility: Secondary | ICD-10-CM | POA: Diagnosis not present

## 2016-08-12 DIAGNOSIS — R2689 Other abnormalities of gait and mobility: Secondary | ICD-10-CM | POA: Diagnosis not present

## 2016-08-12 DIAGNOSIS — R296 Repeated falls: Secondary | ICD-10-CM | POA: Diagnosis not present

## 2016-08-12 DIAGNOSIS — M25512 Pain in left shoulder: Secondary | ICD-10-CM | POA: Diagnosis not present

## 2016-08-12 DIAGNOSIS — M25612 Stiffness of left shoulder, not elsewhere classified: Secondary | ICD-10-CM | POA: Diagnosis not present

## 2016-08-12 DIAGNOSIS — M6281 Muscle weakness (generalized): Secondary | ICD-10-CM | POA: Diagnosis not present

## 2016-08-12 DIAGNOSIS — S42202D Unspecified fracture of upper end of left humerus, subsequent encounter for fracture with routine healing: Secondary | ICD-10-CM | POA: Diagnosis not present

## 2016-08-15 DIAGNOSIS — M25512 Pain in left shoulder: Secondary | ICD-10-CM | POA: Diagnosis not present

## 2016-08-15 DIAGNOSIS — S42202D Unspecified fracture of upper end of left humerus, subsequent encounter for fracture with routine healing: Secondary | ICD-10-CM | POA: Diagnosis not present

## 2016-08-15 DIAGNOSIS — R296 Repeated falls: Secondary | ICD-10-CM | POA: Diagnosis not present

## 2016-08-15 DIAGNOSIS — R2689 Other abnormalities of gait and mobility: Secondary | ICD-10-CM | POA: Diagnosis not present

## 2016-08-15 DIAGNOSIS — M6281 Muscle weakness (generalized): Secondary | ICD-10-CM | POA: Diagnosis not present

## 2016-08-15 DIAGNOSIS — M25612 Stiffness of left shoulder, not elsewhere classified: Secondary | ICD-10-CM | POA: Diagnosis not present

## 2016-08-30 DIAGNOSIS — M109 Gout, unspecified: Secondary | ICD-10-CM | POA: Diagnosis not present

## 2016-08-30 DIAGNOSIS — E039 Hypothyroidism, unspecified: Secondary | ICD-10-CM | POA: Diagnosis not present

## 2016-08-30 DIAGNOSIS — E7800 Pure hypercholesterolemia, unspecified: Secondary | ICD-10-CM | POA: Diagnosis not present

## 2016-08-30 DIAGNOSIS — E1129 Type 2 diabetes mellitus with other diabetic kidney complication: Secondary | ICD-10-CM | POA: Diagnosis not present

## 2016-08-30 DIAGNOSIS — Z125 Encounter for screening for malignant neoplasm of prostate: Secondary | ICD-10-CM | POA: Diagnosis not present

## 2016-09-01 DIAGNOSIS — M0589 Other rheumatoid arthritis with rheumatoid factor of multiple sites: Secondary | ICD-10-CM | POA: Diagnosis not present

## 2016-09-01 DIAGNOSIS — N183 Chronic kidney disease, stage 3 (moderate): Secondary | ICD-10-CM | POA: Diagnosis not present

## 2016-09-01 DIAGNOSIS — E669 Obesity, unspecified: Secondary | ICD-10-CM | POA: Diagnosis not present

## 2016-09-01 DIAGNOSIS — M7582 Other shoulder lesions, left shoulder: Secondary | ICD-10-CM | POA: Diagnosis not present

## 2016-09-01 DIAGNOSIS — Z6834 Body mass index (BMI) 34.0-34.9, adult: Secondary | ICD-10-CM | POA: Diagnosis not present

## 2016-09-01 DIAGNOSIS — M1A09X0 Idiopathic chronic gout, multiple sites, without tophus (tophi): Secondary | ICD-10-CM | POA: Diagnosis not present

## 2016-09-01 DIAGNOSIS — Z79899 Other long term (current) drug therapy: Secondary | ICD-10-CM | POA: Diagnosis not present

## 2016-09-07 DIAGNOSIS — D869 Sarcoidosis, unspecified: Secondary | ICD-10-CM | POA: Diagnosis not present

## 2016-09-07 DIAGNOSIS — Z6835 Body mass index (BMI) 35.0-35.9, adult: Secondary | ICD-10-CM | POA: Diagnosis not present

## 2016-09-07 DIAGNOSIS — E11319 Type 2 diabetes mellitus with unspecified diabetic retinopathy without macular edema: Secondary | ICD-10-CM | POA: Diagnosis not present

## 2016-09-07 DIAGNOSIS — E1129 Type 2 diabetes mellitus with other diabetic kidney complication: Secondary | ICD-10-CM | POA: Diagnosis not present

## 2016-09-07 DIAGNOSIS — M109 Gout, unspecified: Secondary | ICD-10-CM | POA: Diagnosis not present

## 2016-09-07 DIAGNOSIS — N183 Chronic kidney disease, stage 3 (moderate): Secondary | ICD-10-CM | POA: Diagnosis not present

## 2016-09-07 DIAGNOSIS — Z7901 Long term (current) use of anticoagulants: Secondary | ICD-10-CM | POA: Diagnosis not present

## 2016-09-07 DIAGNOSIS — Z1389 Encounter for screening for other disorder: Secondary | ICD-10-CM | POA: Diagnosis not present

## 2016-09-07 DIAGNOSIS — I10 Essential (primary) hypertension: Secondary | ICD-10-CM | POA: Diagnosis not present

## 2016-09-07 DIAGNOSIS — E7800 Pure hypercholesterolemia, unspecified: Secondary | ICD-10-CM | POA: Diagnosis not present

## 2016-09-07 DIAGNOSIS — Z794 Long term (current) use of insulin: Secondary | ICD-10-CM | POA: Diagnosis not present

## 2016-09-07 DIAGNOSIS — Z0000 Encounter for general adult medical examination without abnormal findings: Secondary | ICD-10-CM | POA: Diagnosis not present

## 2016-09-20 ENCOUNTER — Ambulatory Visit (INDEPENDENT_AMBULATORY_CARE_PROVIDER_SITE_OTHER): Payer: Medicare Other | Admitting: Podiatry

## 2016-09-20 ENCOUNTER — Encounter: Payer: Self-pay | Admitting: Podiatry

## 2016-09-20 DIAGNOSIS — M79609 Pain in unspecified limb: Secondary | ICD-10-CM | POA: Diagnosis not present

## 2016-09-20 DIAGNOSIS — B351 Tinea unguium: Secondary | ICD-10-CM | POA: Diagnosis not present

## 2016-09-20 DIAGNOSIS — E1142 Type 2 diabetes mellitus with diabetic polyneuropathy: Secondary | ICD-10-CM

## 2016-09-20 DIAGNOSIS — Q828 Other specified congenital malformations of skin: Secondary | ICD-10-CM

## 2016-09-20 NOTE — Progress Notes (Signed)
He presents today with a history of non-insulin-dependent diabetes mellitus with a chief complaint of painful elongated toenails with corns and calluses bilateral.  Objective: Pulses remain palpable tenderness along thick yellow dystrophic onychomycotic no open lesions or wounds are noted. Multiple porokeratotic lesions and hyperkeratotic lesions plantar aspect bilateral foot.  Assessment: Pain in limb secondary onychomycosis porokeratosis.  Plan: Debridement of all reactive hyperkeratosis and debridement of toenails 1 through 5 bilateral today.

## 2016-09-23 DIAGNOSIS — M25512 Pain in left shoulder: Secondary | ICD-10-CM | POA: Diagnosis not present

## 2016-10-18 DIAGNOSIS — M0589 Other rheumatoid arthritis with rheumatoid factor of multiple sites: Secondary | ICD-10-CM | POA: Diagnosis not present

## 2016-10-18 DIAGNOSIS — Z79899 Other long term (current) drug therapy: Secondary | ICD-10-CM | POA: Diagnosis not present

## 2016-12-20 ENCOUNTER — Ambulatory Visit (INDEPENDENT_AMBULATORY_CARE_PROVIDER_SITE_OTHER): Payer: Medicare Other | Admitting: Podiatry

## 2016-12-20 ENCOUNTER — Encounter: Payer: Self-pay | Admitting: Podiatry

## 2016-12-20 DIAGNOSIS — E1142 Type 2 diabetes mellitus with diabetic polyneuropathy: Secondary | ICD-10-CM

## 2016-12-20 DIAGNOSIS — B351 Tinea unguium: Secondary | ICD-10-CM

## 2016-12-20 DIAGNOSIS — M79609 Pain in unspecified limb: Secondary | ICD-10-CM | POA: Diagnosis not present

## 2016-12-20 DIAGNOSIS — Q828 Other specified congenital malformations of skin: Secondary | ICD-10-CM | POA: Diagnosis not present

## 2016-12-20 NOTE — Progress Notes (Signed)
He presents today chief complaint of painful elongated toenails 1 through 5 bilateral he has multiple porokeratotic lesions plantar aspect of the bilateral foot as well. No lesions or wounds are noted.  Assessment pain in limb Segner onychomycosis and porokeratosis.  Plan: Debridement of toenails 1 through 5 bilateral debridement of all reactive hyperkeratotic tissue.

## 2017-01-16 DIAGNOSIS — E669 Obesity, unspecified: Secondary | ICD-10-CM | POA: Diagnosis not present

## 2017-01-16 DIAGNOSIS — Z79899 Other long term (current) drug therapy: Secondary | ICD-10-CM | POA: Diagnosis not present

## 2017-01-16 DIAGNOSIS — M1A09X0 Idiopathic chronic gout, multiple sites, without tophus (tophi): Secondary | ICD-10-CM | POA: Diagnosis not present

## 2017-01-16 DIAGNOSIS — L570 Actinic keratosis: Secondary | ICD-10-CM | POA: Diagnosis not present

## 2017-01-16 DIAGNOSIS — C44319 Basal cell carcinoma of skin of other parts of face: Secondary | ICD-10-CM | POA: Diagnosis not present

## 2017-01-16 DIAGNOSIS — N183 Chronic kidney disease, stage 3 (moderate): Secondary | ICD-10-CM | POA: Diagnosis not present

## 2017-01-16 DIAGNOSIS — D485 Neoplasm of uncertain behavior of skin: Secondary | ICD-10-CM | POA: Diagnosis not present

## 2017-01-16 DIAGNOSIS — M0589 Other rheumatoid arthritis with rheumatoid factor of multiple sites: Secondary | ICD-10-CM | POA: Diagnosis not present

## 2017-01-16 DIAGNOSIS — Z6835 Body mass index (BMI) 35.0-35.9, adult: Secondary | ICD-10-CM | POA: Diagnosis not present

## 2017-01-16 DIAGNOSIS — M7582 Other shoulder lesions, left shoulder: Secondary | ICD-10-CM | POA: Diagnosis not present

## 2017-03-06 DIAGNOSIS — Z794 Long term (current) use of insulin: Secondary | ICD-10-CM | POA: Diagnosis not present

## 2017-03-06 DIAGNOSIS — I2782 Chronic pulmonary embolism: Secondary | ICD-10-CM | POA: Diagnosis not present

## 2017-03-06 DIAGNOSIS — E1165 Type 2 diabetes mellitus with hyperglycemia: Secondary | ICD-10-CM | POA: Diagnosis not present

## 2017-03-06 DIAGNOSIS — N183 Chronic kidney disease, stage 3 (moderate): Secondary | ICD-10-CM | POA: Diagnosis not present

## 2017-03-06 DIAGNOSIS — H2589 Other age-related cataract: Secondary | ICD-10-CM | POA: Diagnosis not present

## 2017-03-06 DIAGNOSIS — E11319 Type 2 diabetes mellitus with unspecified diabetic retinopathy without macular edema: Secondary | ICD-10-CM | POA: Diagnosis not present

## 2017-03-06 DIAGNOSIS — Z23 Encounter for immunization: Secondary | ICD-10-CM | POA: Diagnosis not present

## 2017-03-06 DIAGNOSIS — E1129 Type 2 diabetes mellitus with other diabetic kidney complication: Secondary | ICD-10-CM | POA: Diagnosis not present

## 2017-03-06 DIAGNOSIS — E668 Other obesity: Secondary | ICD-10-CM | POA: Diagnosis not present

## 2017-03-06 DIAGNOSIS — Z6836 Body mass index (BMI) 36.0-36.9, adult: Secondary | ICD-10-CM | POA: Diagnosis not present

## 2017-03-06 DIAGNOSIS — M069 Rheumatoid arthritis, unspecified: Secondary | ICD-10-CM | POA: Diagnosis not present

## 2017-03-06 DIAGNOSIS — E038 Other specified hypothyroidism: Secondary | ICD-10-CM | POA: Diagnosis not present

## 2017-03-06 DIAGNOSIS — M545 Low back pain: Secondary | ICD-10-CM | POA: Diagnosis not present

## 2017-03-23 ENCOUNTER — Ambulatory Visit (INDEPENDENT_AMBULATORY_CARE_PROVIDER_SITE_OTHER): Payer: Medicare Other | Admitting: Podiatry

## 2017-03-23 ENCOUNTER — Encounter: Payer: Self-pay | Admitting: Podiatry

## 2017-03-23 DIAGNOSIS — Q828 Other specified congenital malformations of skin: Secondary | ICD-10-CM

## 2017-03-23 DIAGNOSIS — M79609 Pain in unspecified limb: Secondary | ICD-10-CM

## 2017-03-23 DIAGNOSIS — B351 Tinea unguium: Secondary | ICD-10-CM

## 2017-03-23 DIAGNOSIS — L905 Scar conditions and fibrosis of skin: Secondary | ICD-10-CM | POA: Diagnosis not present

## 2017-03-23 DIAGNOSIS — N183 Chronic kidney disease, stage 3 (moderate): Secondary | ICD-10-CM | POA: Diagnosis not present

## 2017-03-23 DIAGNOSIS — L570 Actinic keratosis: Secondary | ICD-10-CM | POA: Diagnosis not present

## 2017-03-23 DIAGNOSIS — Z85828 Personal history of other malignant neoplasm of skin: Secondary | ICD-10-CM | POA: Diagnosis not present

## 2017-03-23 NOTE — Progress Notes (Signed)
He presents today with chief complaint of painful elongated toenails and callusplantar aspect of the bilateral foot.    Objective:Vital signs are stable alert and oriented 3.pulses are palpab Neurologic sen is intact. Toenails are thick yellow dystrophic clinically mycotic.  Assessment: Pain and limp secondary to onyis anda 9 soft tissue lesions.  Plan: Debridement of toenails bilateral.

## 2017-03-30 ENCOUNTER — Encounter: Payer: Self-pay | Admitting: Gastroenterology

## 2017-04-18 DIAGNOSIS — Z79899 Other long term (current) drug therapy: Secondary | ICD-10-CM | POA: Diagnosis not present

## 2017-04-18 DIAGNOSIS — M0589 Other rheumatoid arthritis with rheumatoid factor of multiple sites: Secondary | ICD-10-CM | POA: Diagnosis not present

## 2017-06-13 ENCOUNTER — Ambulatory Visit: Payer: Medicare Other | Admitting: Podiatry
